# Patient Record
Sex: Male | Born: 1970 | Race: Black or African American | Hispanic: No | Marital: Married | Smoking: Never smoker
Health system: Southern US, Community
[De-identification: ages and names within clinical notes are randomized; demographics above are authoritative.]

## PROBLEM LIST (undated history)

## (undated) DIAGNOSIS — E782 Mixed hyperlipidemia: Secondary | ICD-10-CM

## (undated) DIAGNOSIS — E559 Vitamin D deficiency, unspecified: Secondary | ICD-10-CM

## (undated) DIAGNOSIS — J302 Other seasonal allergic rhinitis: Secondary | ICD-10-CM

## (undated) DIAGNOSIS — Z973 Presence of spectacles and contact lenses: Secondary | ICD-10-CM

## (undated) DIAGNOSIS — J45909 Unspecified asthma, uncomplicated: Secondary | ICD-10-CM

## (undated) DIAGNOSIS — K219 Gastro-esophageal reflux disease without esophagitis: Secondary | ICD-10-CM

## (undated) DIAGNOSIS — E118 Type 2 diabetes mellitus with unspecified complications: Secondary | ICD-10-CM

## (undated) DIAGNOSIS — G43909 Migraine, unspecified, not intractable, without status migrainosus: Secondary | ICD-10-CM

## (undated) HISTORY — DX: Migraine, unspecified, not intractable, without status migrainosus: G43.909

## (undated) HISTORY — DX: Vitamin D deficiency, unspecified: E55.9

## (undated) HISTORY — DX: Type 2 diabetes mellitus with unspecified complications: E11.8

## (undated) HISTORY — DX: Presence of spectacles and contact lenses: Z97.3

## (undated) HISTORY — PX: COLONOSCOPY: SHX174

## (undated) HISTORY — DX: Gastro-esophageal reflux disease without esophagitis: K21.9

## (undated) HISTORY — DX: Other seasonal allergic rhinitis: J30.2

## (undated) HISTORY — DX: Unspecified asthma, uncomplicated: J45.909

## (undated) HISTORY — DX: Mixed hyperlipidemia: E78.2

---

## 2001-09-06 HISTORY — PX: INGUINAL HERNIA REPAIR: SUR1180

## 2009-09-06 HISTORY — PX: MEDIAL COLLATERAL LIGAMENT REPAIR, KNEE: SHX2019

## 2010-07-03 ENCOUNTER — Emergency Department (HOSPITAL_BASED_OUTPATIENT_CLINIC_OR_DEPARTMENT_OTHER): Admission: EM | Admit: 2010-07-03 | Discharge: 2010-07-03 | Payer: Self-pay | Admitting: Emergency Medicine

## 2010-07-03 ENCOUNTER — Ambulatory Visit: Payer: Self-pay | Admitting: Diagnostic Radiology

## 2010-11-18 LAB — URINALYSIS, ROUTINE W REFLEX MICROSCOPIC
Ketones, ur: NEGATIVE mg/dL
Nitrite: NEGATIVE
Protein, ur: NEGATIVE mg/dL
pH: 5.5 (ref 5.0–8.0)

## 2010-11-18 LAB — DIFFERENTIAL
Eosinophils Absolute: 0.4 10*3/uL (ref 0.0–0.7)
Lymphs Abs: 1.5 10*3/uL (ref 0.7–4.0)
Neutro Abs: 2.4 10*3/uL (ref 1.7–7.7)
Neutrophils Relative %: 52 % (ref 43–77)

## 2010-11-18 LAB — COMPREHENSIVE METABOLIC PANEL
ALT: 24 U/L (ref 0–53)
BUN: 12 mg/dL (ref 6–23)
CO2: 31 mEq/L (ref 19–32)
Calcium: 9.7 mg/dL (ref 8.4–10.5)
Creatinine, Ser: 1 mg/dL (ref 0.4–1.5)
GFR calc non Af Amer: >60 mL/min (ref >60)
Glucose, Bld: 88 mg/dL (ref 70–99)
Sodium: 145 mEq/L (ref 135–145)

## 2010-11-18 LAB — CBC
HCT: 44.6 % (ref 39.0–52.0)
Hemoglobin: 14.7 g/dL (ref 13.0–17.0)
MCH: 27.6 pg (ref 26.0–34.0)
MCHC: 33 g/dL (ref 30.0–36.0)
MCV: 83.7 fL (ref 78.0–100.0)

## 2010-11-18 LAB — LIPASE, BLOOD: Lipase: 83 U/L (ref 23–300)

## 2011-02-09 ENCOUNTER — Ambulatory Visit (HOSPITAL_COMMUNITY)
Admission: RE | Admit: 2011-02-09 | Discharge: 2011-02-09 | Disposition: A | Discharge status: Home / Self Care | Payer: Managed Care, Other (non HMO) | Source: Ambulatory Visit | Attending: Orthopedic Surgery | Admitting: Orthopedic Surgery

## 2011-02-09 ENCOUNTER — Encounter (HOSPITAL_COMMUNITY)
Admission: RE | Admit: 2011-02-09 | Discharge: 2011-02-09 | Disposition: A | Discharge status: Home / Self Care | Payer: Managed Care, Other (non HMO) | Source: Ambulatory Visit | Attending: Orthopedic Surgery | Admitting: Orthopedic Surgery

## 2011-02-09 ENCOUNTER — Other Ambulatory Visit (HOSPITAL_COMMUNITY): Payer: Self-pay | Admitting: Orthopedic Surgery

## 2011-02-09 DIAGNOSIS — M5412 Radiculopathy, cervical region: Secondary | ICD-10-CM

## 2011-02-09 DIAGNOSIS — Z01810 Encounter for preprocedural cardiovascular examination: Secondary | ICD-10-CM | POA: Insufficient documentation

## 2011-02-09 DIAGNOSIS — Z01818 Encounter for other preprocedural examination: Secondary | ICD-10-CM | POA: Insufficient documentation

## 2011-02-09 DIAGNOSIS — Z01812 Encounter for preprocedural laboratory examination: Secondary | ICD-10-CM | POA: Insufficient documentation

## 2011-02-09 LAB — URINALYSIS, ROUTINE W REFLEX MICROSCOPIC
Glucose, UA: NEGATIVE mg/dL
Hgb urine dipstick: NEGATIVE
Ketones, ur: NEGATIVE mg/dL
Protein, ur: NEGATIVE mg/dL

## 2011-02-09 LAB — DIFFERENTIAL
Eosinophils Absolute: 0.3 10*3/uL (ref 0.0–0.7)
Eosinophils Relative: 5 % (ref 0–5)
Lymphs Abs: 1.5 10*3/uL (ref 0.7–4.0)
Monocytes Absolute: 0.3 10*3/uL (ref 0.1–1.0)
Monocytes Relative: 6 % (ref 3–12)

## 2011-02-09 LAB — COMPREHENSIVE METABOLIC PANEL
AST: 19 U/L (ref 0–37)
Alkaline Phosphatase: 65 U/L (ref 39–117)
BUN: 9 mg/dL (ref 6–23)
CO2: 30 mEq/L (ref 19–32)
Chloride: 102 mEq/L (ref 96–112)
Creatinine, Ser: 0.97 mg/dL (ref 0.4–1.5)
GFR calc Af Amer: >60 mL/min (ref >60)
GFR calc non Af Amer: >60 mL/min (ref >60)
Total Bilirubin: 0.6 mg/dL (ref 0.3–1.2)

## 2011-02-09 LAB — CBC
MCH: 27.2 pg (ref 26.0–34.0)
MCV: 83 fL (ref 78.0–100.0)
Platelets: 206 10*3/uL (ref 150–400)
RDW: 14 % (ref 11.5–15.5)

## 2011-02-09 LAB — TYPE AND SCREEN

## 2011-02-11 ENCOUNTER — Ambulatory Visit (HOSPITAL_COMMUNITY): Payer: Managed Care, Other (non HMO)

## 2011-02-11 ENCOUNTER — Ambulatory Visit (HOSPITAL_COMMUNITY)
Admission: RE | Admit: 2011-02-11 | Discharge: 2011-02-12 | DRG: 473 | Disposition: A | Discharge status: Home / Self Care | Payer: Managed Care, Other (non HMO) | Source: Ambulatory Visit | Attending: Orthopedic Surgery | Admitting: Orthopedic Surgery

## 2011-02-11 DIAGNOSIS — Z01812 Encounter for preprocedural laboratory examination: Secondary | ICD-10-CM | POA: Insufficient documentation

## 2011-02-11 DIAGNOSIS — Z01818 Encounter for other preprocedural examination: Secondary | ICD-10-CM | POA: Insufficient documentation

## 2011-02-11 DIAGNOSIS — M4802 Spinal stenosis, cervical region: Secondary | ICD-10-CM | POA: Insufficient documentation

## 2011-02-11 DIAGNOSIS — M5030 Other cervical disc degeneration, unspecified cervical region: Secondary | ICD-10-CM | POA: Insufficient documentation

## 2011-02-11 DIAGNOSIS — Z01810 Encounter for preprocedural cardiovascular examination: Secondary | ICD-10-CM | POA: Insufficient documentation

## 2011-02-16 NOTE — Op Note (Signed)
Mark Arroyo, Mark NO.:  [id number]  MEDICAL RECORD NO.:  [id number]  LOCATION:  3533                         FACILITY:  [facility]  PHYSICIAN:  Estill Bamberg, MD      DATE OF BIRTH:  11/03/1970  DATE OF PROCEDURE:  02/11/2011 DATE OF DISCHARGE:                              OPERATIVE REPORT   PREOPERATIVE DIAGNOSES: 1. Right-sided C7, C8 radiculopathy. 2. C6-7, C7-T1 degenerative disk disease.  POSTOPERATIVE DIAGNOSES: 1. Right-sided C7, C8 radiculopathy. 2. C6-7, C7-T1 degenerative disk disease.  PROCEDURE: 1. Anterior cervical decompression and fusion C6-7, C7-T1, placement     of structural allograft x2. 2. Use of local autograft. 3. Placement of anterior instrumentation C6-C1. 4. Intraoperative use of fluoroscopy.  SURGEON:  Estill Bamberg, MD.  ASSISTANT:  None.  ANESTHESIA:  General endotracheal anesthesia.  COMPLICATIONS:  None.  DISPOSITION:  Stable.  ESTIMATED BLOOD LOSS:  Minimal.  INDICATIONS FOR PROCEDURE:  Briefly, Mark Arroyo is a very pleasant 40- year-old male, who presented to my office with severe debilitating pain in his right arm.  He did go forward extensive nonoperative measures including epidural injections and the patient did go onto have pain.  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  I did feel that these findings did correlate to the patient's right arm pain.  We therefore did have a discussion regarding going forward with a C6-7 and C7-T1 ACDF to help alleviate the patient's pain in his right arm.  The patient did fully understand the risks and limitations of the procedure as outlined in my preoperative note.  OPERATIVE DETAILS:  On February 11, 2011, the patient was brought to surgery and general endotracheal anesthesia was administered.  The patient was placed supine on a hospital bed and the neck was placed in a gentle degree of extension.  The  shoulders taped into the inferior aspect of the bed.  Ancef was given and SCDs were placed.  Time-out procedure was performed.  I did bring in a lateral intraoperative fluoroscopic view to help optimize the location of the incision.  The neck was then prepped and draped in the usual sterile fashion.  I then made a transverse incision from approximately the midline to approximately the medial border of the sternocleidomastoid muscle.  The plane between the sternocleidomastoid muscle laterally and the strap muscles medially was identified and explored in the anterior cervical spine was readily identified.  I did place a clamp over the C6-7 interspace and a lateral fluoroscopic view did confirm this to be the appropriate level.  I then subperiosteally exposed the vertebral bodies of C6, C7, and T1 out to the uncovertebral joints bilaterally.  A self-retaining shadow line retractor was placed and I first turned my attention towards the C7-T1 interspace.  A preliminary diskectomy was performed.  I then placed Caspar pins into the C7-T1 vertebral bodies and gentle distraction was applied.  Of note, there were osteophytes noted anteriorly, which was removed with a rongeur and placed on the back table for later use.  I then went forward with a thorough diskectomy from front to back and out to  the uncovertebral joints bilaterally.  The posterior longitudinal ligament was readily identified and was entered using a nerve hook followed by #1 Kerrison.  I was able to fully adequate decompress the right neural foramen, the area of the neural foraminal stenosis.  At the termination of the decompression, I was easily able to pass a nerve hook out the neural foramen.  I then gently prepare the endplates using curettes and a 3-mm high-speed bur.  I did feel that a 7-mm intervertebral graft would be the appropriate fit.  I then hydrated a 7- mm allograft from the [employer].   The allograft was shaped into position and autograft obtained from the decortication aspect of the procedure was placed above and below the graft to help optimize the fusion success.  The allograft was tamped into position in the usual fashion under distraction.  The lower Caspar pin was removed and the bone wax was placed.  I then turned my attention towards the patient's C6-7 interspace.  I again perform an anterior cervical decompression in the manner described previously.  At the termination of the decompression, I was again easily able to pass the nerve hook out of the neural foramen on the right side.  I felt at this level that an 8-mm allograft would be the appropriate fit.  After preparing the endplates, a 7.9-mm allograft was tamped into position and I did again place autograft above and below the allograft to help optimize the fusion success.  I was very happy with the final press fit of each intervertebral graft.  I then removed the remaining two Caspar pins and bone wax was applied.  I then chose an appropriately sized Synthes Vector plate and this was bent into the appropriate amount of lordosis.  I placed the plate over the anterior cervical spine and 16-mm self-drilling and self-tapping screws were placed into the vertebral bodies of C6, C7, and T1.  I was very happy with the purchase of the screws.  Of note, AP and lateral fluoroscopic views were used to help optimize the trajectory of the screws.  I then copiously irrigated the wound.  The platysma and subcutaneous layer was closed using 2-0 Vicryl and the skin was closed using 4-0 Monocryl.  Sterile dressing was applied as was a hard cervical collar.  The patient was then transferred to recovery in stable condition.  Of note, the instrument count was correct at the termination of the procedure.     Estill Bamberg, MD     MD/MEDQ  D:  02/11/2011  T:  02/12/2011  Job:  [id number]  Electronically Signed by Estill Bamberg  on 02/16/2011 04:35:06 PM

## 2011-03-20 NOTE — Discharge Summary (Signed)
  NAMEHO, PARISI NO.:  [id number]  MEDICAL RECORD NO.:  [id number]  LOCATION:  3533                         FACILITY:  [facility]  PHYSICIAN:  Estill Bamberg, MD      DATE OF BIRTH:  10-22-1970  DATE OF ADMISSION:  02/11/2011 DATE OF DISCHARGE:  02/12/2011                              DISCHARGE SUMMARY   ADMISSION DIAGNOSIS:  Right-sided C7-C8 radiculopathy.  DISCHARGE DIAGNOSIS:  Right-sided C7-C8 radiculopathy.  PROCEDURE:  C6-C7, C7-T1 anterior cervical decompression and fusion performed on February 11, 2011.  ADMISSION HISTORY:  Briefly, Mark Arroyo is a pleasant 40 year old male who presented to my office with severe debilitating pain in his right arm.  The patient failed conservative care and was therefore admitted on the date above for the procedure noted above.  HOSPITAL COURSE:  On February 11, 2011, the patient was admitted and he underwent the procedure noted above.  The patient tolerated theprocedure well and was transferred to recovery in stable condition.  The patient noted immediate resolution of his right arm pain immediately following his procedure.  Upon my evaluation, the patient on the morning of postop day #1, the patient was noted to be neurovascularly intact and the patient did report complete resolution of his right arm pain.  The patient was uneventfully discharged on the morning of postoperative day #1.  DISCHARGE INSTRUCTIONS:  The patient will be maintained in his Aspen cervical collar for a total of 4 weeks.  He will keep the wound dry for a total of 5 days, at which point he can shower.  He will avoid lifting over 10 pounds.  He will take Percocet for pain and Valium for spasms. I will see the patient back in my office in approximately 2 weeks after his procedure.     Estill Bamberg, MD     MD/MEDQ  D:  03/15/2011  T:  03/15/2011  Job:  [id number]  Electronically Signed by Estill Bamberg  on 03/20/2011 04:15:40 PM

## 2011-07-23 NOTE — H&P (Signed)
PREOPERATIVE H&P  Chief Complaint: Right arm pain  HPI: Mark Arroyo is a 40 y.o. male who presents for right arm pain   History   Social History  . Marital Status: Single    Spouse Name: N/A    Number of Children: N/A  . Years of Education: N/A   Social History Main Topics  . Smoking status: Not on file  . Smokeless tobacco: Not on file  . Alcohol Use: Not on file  . Drug Use: Not on file  . Sexually Active: Not on file   Other Topics Concern  . Not on file   Social History Narrative  . No narrative on file   No family history on file. Allergies not on file Prior to Admission medications   Not on File     All other systems have been reviewed and were otherwise negative with the exception of those mentioned in the HPI and as above.  Physical Exam: There were no vitals filed for this visit.  General: Alert, no acute distress Cardiovascular: No pedal edema Respiratory: No cyanosis, no use of accessory musculature GI: No organomegaly, abdomen is soft and non-tender Skin: No lesions in the area of chief complaint Neurologic: Sensation intact distally Psychiatric: Patient is competent for consent with normal mood and affect Lymphatic: No axillary or cervical lymphadenopathy  MUSCULOSKELETAL: + spurlings sign on right  Assessment/Plan: Right cervical radiculopathy - plan for ACDF  Eleisha Branscomb LEONARD 07/23/2011 1:20 PM

## 2011-09-07 HISTORY — PX: VASECTOMY: SHX75

## 2011-09-07 HISTORY — PX: ANTERIOR CERVICAL DECOMP/DISCECTOMY FUSION: SHX1161

## 2013-08-06 HISTORY — PX: SHOULDER SURGERY: SHX246

## 2013-09-20 ENCOUNTER — Ambulatory Visit: Payer: Managed Care, Other (non HMO) | Attending: Orthopedic Surgery

## 2013-09-20 ENCOUNTER — Ambulatory Visit: Payer: Managed Care, Other (non HMO)

## 2013-09-20 DIAGNOSIS — M25619 Stiffness of unspecified shoulder, not elsewhere classified: Secondary | ICD-10-CM | POA: Insufficient documentation

## 2013-09-20 DIAGNOSIS — M25519 Pain in unspecified shoulder: Secondary | ICD-10-CM | POA: Insufficient documentation

## 2013-09-20 DIAGNOSIS — M6281 Muscle weakness (generalized): Secondary | ICD-10-CM | POA: Insufficient documentation

## 2013-09-20 DIAGNOSIS — IMO0001 Reserved for inherently not codable concepts without codable children: Secondary | ICD-10-CM | POA: Insufficient documentation

## 2013-09-25 ENCOUNTER — Ambulatory Visit: Payer: Managed Care, Other (non HMO)

## 2013-09-27 ENCOUNTER — Ambulatory Visit: Payer: Managed Care, Other (non HMO)

## 2013-10-02 ENCOUNTER — Ambulatory Visit: Payer: Managed Care, Other (non HMO)

## 2013-10-04 ENCOUNTER — Ambulatory Visit: Payer: Managed Care, Other (non HMO)

## 2013-10-09 ENCOUNTER — Ambulatory Visit: Payer: Managed Care, Other (non HMO) | Attending: Orthopedic Surgery | Admitting: Rehabilitation

## 2013-10-09 DIAGNOSIS — M25619 Stiffness of unspecified shoulder, not elsewhere classified: Secondary | ICD-10-CM | POA: Insufficient documentation

## 2013-10-09 DIAGNOSIS — M6281 Muscle weakness (generalized): Secondary | ICD-10-CM | POA: Insufficient documentation

## 2013-10-09 DIAGNOSIS — M25519 Pain in unspecified shoulder: Secondary | ICD-10-CM | POA: Insufficient documentation

## 2013-10-09 DIAGNOSIS — IMO0001 Reserved for inherently not codable concepts without codable children: Secondary | ICD-10-CM | POA: Insufficient documentation

## 2013-10-11 ENCOUNTER — Ambulatory Visit: Payer: Managed Care, Other (non HMO) | Admitting: Rehabilitation

## 2013-10-16 ENCOUNTER — Ambulatory Visit: Payer: Managed Care, Other (non HMO) | Admitting: Rehabilitation

## 2013-10-18 ENCOUNTER — Ambulatory Visit: Payer: Managed Care, Other (non HMO) | Admitting: Rehabilitation

## 2013-10-22 ENCOUNTER — Ambulatory Visit: Payer: Managed Care, Other (non HMO)

## 2013-10-25 ENCOUNTER — Ambulatory Visit: Payer: Managed Care, Other (non HMO)

## 2013-10-29 ENCOUNTER — Encounter: Payer: Managed Care, Other (non HMO) | Admitting: Rehabilitation

## 2013-10-31 ENCOUNTER — Encounter: Payer: Managed Care, Other (non HMO) | Admitting: Rehabilitation

## 2013-10-31 ENCOUNTER — Ambulatory Visit: Payer: Managed Care, Other (non HMO) | Admitting: Rehabilitation

## 2013-11-06 ENCOUNTER — Encounter: Payer: Managed Care, Other (non HMO) | Admitting: Rehabilitation

## 2013-11-07 ENCOUNTER — Ambulatory Visit: Payer: Managed Care, Other (non HMO) | Attending: Orthopedic Surgery

## 2013-11-07 DIAGNOSIS — IMO0001 Reserved for inherently not codable concepts without codable children: Secondary | ICD-10-CM | POA: Insufficient documentation

## 2013-11-07 DIAGNOSIS — M6281 Muscle weakness (generalized): Secondary | ICD-10-CM | POA: Insufficient documentation

## 2013-11-07 DIAGNOSIS — M25619 Stiffness of unspecified shoulder, not elsewhere classified: Secondary | ICD-10-CM | POA: Insufficient documentation

## 2013-11-07 DIAGNOSIS — M25519 Pain in unspecified shoulder: Secondary | ICD-10-CM | POA: Insufficient documentation

## 2013-11-12 ENCOUNTER — Ambulatory Visit: Payer: Managed Care, Other (non HMO) | Admitting: Rehabilitation

## 2013-11-15 ENCOUNTER — Ambulatory Visit: Payer: Managed Care, Other (non HMO)

## 2013-11-21 ENCOUNTER — Ambulatory Visit: Payer: Managed Care, Other (non HMO) | Admitting: Rehabilitation

## 2013-11-26 ENCOUNTER — Ambulatory Visit: Payer: Managed Care, Other (non HMO) | Admitting: Rehabilitation

## 2013-11-27 ENCOUNTER — Ambulatory Visit: Payer: Managed Care, Other (non HMO)

## 2013-12-04 ENCOUNTER — Ambulatory Visit: Payer: Managed Care, Other (non HMO) | Admitting: Rehabilitation

## 2013-12-06 ENCOUNTER — Ambulatory Visit: Payer: Managed Care, Other (non HMO) | Attending: Orthopedic Surgery | Admitting: Rehabilitation

## 2013-12-06 DIAGNOSIS — M6281 Muscle weakness (generalized): Secondary | ICD-10-CM | POA: Insufficient documentation

## 2013-12-06 DIAGNOSIS — IMO0001 Reserved for inherently not codable concepts without codable children: Secondary | ICD-10-CM | POA: Insufficient documentation

## 2013-12-06 DIAGNOSIS — M25519 Pain in unspecified shoulder: Secondary | ICD-10-CM | POA: Insufficient documentation

## 2013-12-06 DIAGNOSIS — M25619 Stiffness of unspecified shoulder, not elsewhere classified: Secondary | ICD-10-CM | POA: Insufficient documentation

## 2013-12-10 ENCOUNTER — Ambulatory Visit: Payer: Managed Care, Other (non HMO) | Admitting: Rehabilitation

## 2013-12-12 ENCOUNTER — Ambulatory Visit: Payer: Managed Care, Other (non HMO) | Admitting: Rehabilitation

## 2013-12-17 ENCOUNTER — Ambulatory Visit: Payer: Managed Care, Other (non HMO) | Admitting: Rehabilitation

## 2013-12-19 ENCOUNTER — Ambulatory Visit: Payer: Managed Care, Other (non HMO) | Admitting: Rehabilitation

## 2014-07-02 ENCOUNTER — Ambulatory Visit (INDEPENDENT_AMBULATORY_CARE_PROVIDER_SITE_OTHER): Payer: Managed Care, Other (non HMO) | Admitting: Medical

## 2014-07-02 ENCOUNTER — Encounter: Payer: Self-pay | Admitting: Medical

## 2014-07-02 VITALS — BP 100/70 | HR 64 | Temp 98.0°F | Resp 16 | Ht 71.0 in | Wt 223.0 lb

## 2014-07-02 DIAGNOSIS — J4521 Mild intermittent asthma with (acute) exacerbation: Secondary | ICD-10-CM

## 2014-07-02 DIAGNOSIS — J0110 Acute frontal sinusitis, unspecified: Secondary | ICD-10-CM

## 2014-07-02 MED ORDER — AMOXICILLIN-POT CLAVULANATE 875-125 MG PO TABS: 1.0000 | ORAL_TABLET | Freq: Two times a day (BID) | ORAL | Status: DC

## 2014-07-02 MED ORDER — METHYLPREDNISOLONE ACETATE PF 80 MG/ML IJ SUSP
60.0000 mg | Freq: Once | INTRAMUSCULAR | Status: AC
  Administered 2014-07-02: 60 mg via INTRAMUSCULAR

## 2014-07-02 NOTE — Addendum Note (Signed)
Addended by: Janeice RobinsonSCALES, Loren Vicens L on: 07/02/2014 02:59 PM   Modules accepted: Orders

## 2014-07-02 NOTE — Progress Notes (Signed)
Subjective:  Mark Arroyo is a 43 y.o. male who presents for possible sinus infection. Here as a new patient today  Symptoms include week history of sinus pressure, ear pressure, yellow nasal drainage, chills, cough, headache, wheezing, as history of asthma, congestion.  Denies fever, nausea vomiting, diarrhea, no sore throat.  He complains of tender nodes. He notes a history of chronic sinus infections and asthma.  He is a nonsmoker. Using Mucinex and Allegra. He has 1 positive sick contact.  No other aggravating or relieving factors.  No other c/o.  ROS as in subjective   Objective: Filed Vitals:   07/02/14 1343  BP: 100/70  Pulse: 64  Temp: 98 F (36.7 C)  Resp: 16    General appearance: Alert, WD/WN, no distress                             Skin: warm, no rash                           Head: + tender maxillary and frontal sinus tenderness,                            Eyes: conjunctiva normal, corneas clear, PERRLA                            Ears: pearly TMs, external ear canals normal                          Nose: septum midline, turbinates swollen, with erythema and mucoid discharge             Mouth/throat: MMM, tongue normal, mild pharyngeal erythema                           Neck: supple, no adenopathy, no thyromegaly, nontender                          Heart: RRR, normal S1, S2, no murmurs                         Lungs: CTA bilaterally, no wheezes, rales, or rhonchi      Assessment and Plan:  Encounter Diagnoses  Name Primary?  . Acute frontal sinusitis, recurrence not specified Yes  . Asthma with acute exacerbation, mild intermittent     Prescription given for Augmentin, 60mg  DepoMedrol IM today.  Can use OTC Mucinex for congestion.  Tylenol or Ibuprofen OTC for fever and malaise.  Asthma hx/o is mild intermittent, discussed symptoms that would prompt recheck, more intense therapy.   Discussed symptomatic relief, nasal saline flush, and call or return if worse or not  improving in 2-3 days.  Will request prior records from [facility] [place]

## 2014-07-22 ENCOUNTER — Telehealth: Payer: Self-pay | Admitting: Medical

## 2014-07-22 NOTE — Telephone Encounter (Signed)
Records received from previous provider. Sending back for review. Please note what needs to be abstracted or scanned.

## 2014-09-17 ENCOUNTER — Encounter: Payer: Self-pay | Admitting: Medical

## 2015-01-15 ENCOUNTER — Ambulatory Visit (INDEPENDENT_AMBULATORY_CARE_PROVIDER_SITE_OTHER): Payer: Managed Care, Other (non HMO) | Admitting: Medical

## 2015-01-15 ENCOUNTER — Encounter: Payer: Self-pay | Admitting: Medical

## 2015-01-15 VITALS — BP 100/60 | HR 67 | Temp 98.0°F | Resp 16 | Ht 71.0 in | Wt 214.0 lb

## 2015-01-15 DIAGNOSIS — Z23 Encounter for immunization: Secondary | ICD-10-CM | POA: Diagnosis not present

## 2015-01-15 DIAGNOSIS — Z0000 Encounter for general adult medical examination without abnormal findings: Secondary | ICD-10-CM | POA: Diagnosis not present

## 2015-01-15 DIAGNOSIS — J4520 Mild intermittent asthma, uncomplicated: Secondary | ICD-10-CM | POA: Insufficient documentation

## 2015-01-15 DIAGNOSIS — Z125 Encounter for screening for malignant neoplasm of prostate: Secondary | ICD-10-CM | POA: Diagnosis not present

## 2015-01-15 LAB — CBC
HCT: 44.7 % (ref 39.0–52.0)
HEMOGLOBIN: 14.5 g/dL (ref 13.0–17.0)
MCH: 26.2 pg (ref 26.0–34.0)
MCHC: 32.4 g/dL (ref 30.0–36.0)
MCV: 80.8 fL (ref 78.0–100.0)
MPV: 11 fL (ref 8.6–12.4)
PLATELETS: 213 10*3/uL (ref 150–400)
RBC: 5.53 MIL/uL (ref 4.22–5.81)
RDW: 14.5 % (ref 11.5–15.5)
WBC: 4.6 10*3/uL (ref 4.0–10.5)

## 2015-01-15 LAB — COMPREHENSIVE METABOLIC PANEL
ALK PHOS: 56 U/L (ref 39–117)
ALT: 15 U/L (ref 0–53)
AST: 13 U/L (ref 0–37)
Albumin: 4.2 g/dL (ref 3.5–5.2)
BUN: 8 mg/dL (ref 6–23)
CO2: 29 mEq/L (ref 19–32)
Calcium: 9.4 mg/dL (ref 8.4–10.5)
Chloride: 102 mEq/L (ref 96–112)
Creat: 0.92 mg/dL (ref 0.50–1.35)
Glucose, Bld: 93 mg/dL (ref 70–99)
Potassium: 4 mEq/L (ref 3.5–5.3)
SODIUM: 138 meq/L (ref 135–145)
Total Bilirubin: 1 mg/dL (ref 0.2–1.2)
Total Protein: 7.3 g/dL (ref 6.0–8.3)

## 2015-01-15 LAB — POCT URINALYSIS DIPSTICK
BILIRUBIN UA: NEGATIVE
GLUCOSE UA: NEGATIVE
KETONES UA: NEGATIVE
Leukocytes, UA: NEGATIVE
NITRITE UA: NEGATIVE
PROTEIN UA: NEGATIVE
RBC UA: NEGATIVE
Spec Grav, UA: >=1.03
UROBILINOGEN UA: NEGATIVE
pH, UA: 6

## 2015-01-15 LAB — LIPID PANEL
Cholesterol: 198 mg/dL (ref 0–200)
HDL: 49 mg/dL (ref >=40)
LDL CALC: 130 mg/dL — AB (ref 0–99)
Total CHOL/HDL Ratio: 4 Ratio
Triglycerides: 97 mg/dL (ref <150)
VLDL: 19 mg/dL (ref 0–40)

## 2015-01-15 LAB — TSH: TSH: 1.804 u[IU]/mL (ref 0.350–4.500)

## 2015-01-15 MED ORDER — ALBUTEROL SULFATE 108 (90 BASE) MCG/ACT IN AEPB: 2.0000 | INHALATION_SPRAY | Freq: Four times a day (QID) | RESPIRATORY_TRACT | Status: DC | PRN

## 2015-01-15 NOTE — Progress Notes (Signed)
Subjective:   HPI  Mark Arroyo is a 44 y.o. male who presents for a complete physical.   Preventative care: Last ophthalmology visit: YES UNSURE OF THE NAME OF THE DOCTOR. LAST VISIT 04/2014 Last dental visit: YES UNSURE OF THE NAME SEEN 2 X A YEAR Last colonoscopy:N/A Last prostate exam: ? Last EKG:N/A Last labs:?  Prior vaccinations: TD or Tdap: WITHIN 10 YEARS  Influenza:DECLINED Pneumococcal:N/A Shingles/Zostavax:N/A  Concerns: none  Reviewed their medical, surgical, family, social, medication, and allergy history and updated chart as appropriate.  Past Medical History  Diagnosis Date  . Asthma   . Seasonal allergic rhinitis   . Wears glasses   . Migraines     usually stress related, no frequent headaches since 04/2014  . GERD (gastroesophageal reflux disease)     Past Surgical History  Procedure Laterality Date  . Anterior cervical decomp/discectomy fusion  2013    Dr. Yevette Edwardsumonski  . Medial collateral ligament repair, knee  2011    left  . Shoulder surgery  08/2013    left, labrum repair, RTC repair, spur removal; [facility]  . Inguinal hernia repair  2003    right  . Nasal sinus surgery      x2  . Colonoscopy  38    due to stomach issues, normal, [facility]    History   Social History  . Marital Status: Married    Spouse Name: N/A  . Number of Children: N/A  . Years of Education: N/A   Occupational History  . Not on file.   Social History Main Topics  . Smoking status: Never Smoker   . Smokeless tobacco: Not on file  . Alcohol Use: No  . Drug Use: No  . Sexual Activity: Not on file   Other Topics Concern  . 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  Christian.  Hobbies - likes basketball, video games    Family History  Problem Relation Age of Onset  . Diabetes Mother   . Heart disease Father [id number]    MI   . Kidney disease Sister   . Diabetes Brother   . Cancer Neg Hx      Current outpatient prescriptions:  .  Albuterol Sulfate (PROAIR HFA IN), Inhale into the lungs., Disp: , Rfl:  .  Albuterol Sulfate (PROAIR RESPICLICK) 108 (90 BASE) MCG/ACT AEPB, Inhale 2 puffs into the lungs every 6 (six) hours as needed., Disp: 1 each, Rfl: 0  No Known Allergies   Review of Systems Constitutional: -fever, -chills, -sweats, -unexpected weight change, -decreased appetite, -fatigue Allergy: -sneezing, -itching, +congestion Dermatology: -changing moles, --rash, -lumps ENT: -runny nose, -ear pain, -sore throat, -hoarseness, -sinus pain, -teeth pain, - ringing in ears, -hearing loss, -nosebleeds Cardiology: -chest pain, -palpitations, -swelling, -difficulty breathing when lying flat, -waking up short of breath Respiratory: -cough, -shortness of breath, -difficulty breathing with exercise or exertion, +wheezing, -coughing up blood Gastroenterology: -abdominal pain, -nausea, -vomiting, -diarrhea, -constipation, -blood in stool, -changes in bowel movement, -difficulty swallowing or eating Hematology: -bleeding, -bruising  Musculoskeletal: -joint aches, -muscle aches, -joint swelling, -back pain, -neck pain, -cramping, -changes in gait Ophthalmology: denies vision changes, eye redness, itching, discharge Urology: -burning with urination, -difficulty urinating, -blood in urine, -urinary frequency, -urgency, -incontinence Neurology: +headache, -weakness, -tingling, -numbness, -memory loss, -falls, -dizziness Psychology: -depressed mood, -agitation, -sleep problems  Objective:   Physical Exam  BP 100/60 mmHg  Pulse 67  Temp(Src) 98 F (36.7 C) (Oral)  Resp 16  Ht 5\' 11"  (1.803 m)  Wt 214 lb (97.07 kg)  BMI 29.86 kg/m2  General appearance: alert, no distress, WD/WN, AA male Skin: 2 small flat brown birth marks <4cm diameter in left and low back, few scattered macules, no worrisome lesions, right  upper arm lateraly with dagger tattoo HEENT: normocephalic, conjunctiva/corneas normal, sclerae anicteric, PERRLA, EOMi, nares patent, no discharge or erythema, pharynx normal Oral cavity: MMM, tongue normal, teeth normal Neck: faint anterior neck surgical scar, supple, no lymphadenopathy, no thyromegaly, no masses, normal ROM, no bruits Chest: non tender, normal shape and expansion Heart: RRR, normal S1, S2, no murmurs Lungs: CTA bilaterally, no wheezes, rhonchi, or rales Abdomen: +bs, soft, non tender, non distended, no masses, no hepatomegaly, no splenomegaly, no bruits Back: non tender, normal ROM, no scoliosis Musculoskeletal: port surgical scars of left shoulder and left knee, upper extremities non tender, no obvious deformity, normal ROM throughout, lower extremities non tender, no obvious deformity, normal ROM throughout Extremities: no edema, no cyanosis, no clubbing Pulses: 2+ symmetric, upper and lower extremities, normal cap refill Neurological: alert, oriented x 3, CN2-12 intact, strength normal upper extremities and lower extremities, sensation normal throughout, DTRs 2+ throughout, no cerebellar signs, gait normal Psychiatric: normal affect, behavior normal, pleasant  GU: normal male external genitalia, circumcised, nontender, left inguinal hernia surgical scar, no masses, no hernia, no lymphadenopathy Rectal: anus normal tone, prostate WNL   Assessment and Plan :    Encounter Diagnoses  Name Primary?  . Encounter for health maintenance examination in adult Yes  . Asthma, mild intermittent, uncomplicated   . Screening for prostate cancer   . Need for prophylactic vaccination against Streptococcus pneumoniae (pneumococcus)     Physical exam - discussed healthy lifestyle, diet, exercise, preventative care, vaccinations, and addressed their concerns.   Routine screening labs today See your eye doctor yearly for routine vision care. See your dentist yearly for routine dental  care including hygiene visits twice yearly.  Asthma - mild intermittent, avoid triggers, albuterol prn Discussed prostate and testicular cancer screening, risks/benefits  Vaccines: Counseled on the pneumococcal vaccine.  Vaccine information sheet given.  Pneumococcal vaccine, [id number] given after consent obtained. Advised yearly flu vaccine  Follow-up pending labs

## 2015-01-15 NOTE — Addendum Note (Signed)
Addended by: Leretha DykesSCALES, Chriss Redel L on: 01/15/2015 11:50 AM   Modules accepted: Orders

## 2015-01-16 LAB — HEMOGLOBIN A1C
Hgb A1c MFr Bld: 6.1 % — ABNORMAL HIGH (ref <5.7)
Mean Plasma Glucose: 128 mg/dL — ABNORMAL HIGH (ref <117)

## 2015-01-16 LAB — PSA: PSA: 0.69 ng/mL (ref <=4.00)

## 2015-05-12 ENCOUNTER — Emergency Department (HOSPITAL_COMMUNITY): Payer: Managed Care, Other (non HMO)

## 2015-05-12 ENCOUNTER — Emergency Department (HOSPITAL_COMMUNITY)
Admission: EM | Admit: 2015-05-12 | Discharge: 2015-05-12 | Disposition: A | Discharge status: Home / Self Care | Payer: Managed Care, Other (non HMO) | Attending: Emergency Medicine | Admitting: Emergency Medicine

## 2015-05-12 DIAGNOSIS — Z8679 Personal history of other diseases of the circulatory system: Secondary | ICD-10-CM | POA: Insufficient documentation

## 2015-05-12 DIAGNOSIS — G510 Bell's palsy: Secondary | ICD-10-CM | POA: Insufficient documentation

## 2015-05-12 DIAGNOSIS — Z8719 Personal history of other diseases of the digestive system: Secondary | ICD-10-CM | POA: Insufficient documentation

## 2015-05-12 DIAGNOSIS — R29810 Facial weakness: Secondary | ICD-10-CM

## 2015-05-12 DIAGNOSIS — Z79899 Other long term (current) drug therapy: Secondary | ICD-10-CM | POA: Insufficient documentation

## 2015-05-12 DIAGNOSIS — J45909 Unspecified asthma, uncomplicated: Secondary | ICD-10-CM | POA: Diagnosis not present

## 2015-05-12 DIAGNOSIS — R51 Headache: Secondary | ICD-10-CM | POA: Diagnosis present

## 2015-05-12 LAB — CBC
HCT: 44.3 % (ref 39.0–52.0)
Hemoglobin: 14.2 g/dL (ref 13.0–17.0)
MCH: 26.2 pg (ref 26.0–34.0)
MCHC: 32.1 g/dL (ref 30.0–36.0)
MCV: 81.9 fL (ref 78.0–100.0)
Platelets: 199 10*3/uL (ref 150–400)
RBC: 5.41 MIL/uL (ref 4.22–5.81)
RDW: 14 % (ref 11.5–15.5)
WBC: 5.8 10*3/uL (ref 4.0–10.5)

## 2015-05-12 LAB — I-STAT CHEM 8, ED
BUN: 10 mg/dL (ref 6–20)
CALCIUM ION: 1.2 mmol/L (ref 1.12–1.23)
Chloride: 102 mmol/L (ref 101–111)
Creatinine, Ser: 1.1 mg/dL (ref 0.61–1.24)
Glucose, Bld: 112 mg/dL — ABNORMAL HIGH (ref 65–99)
HEMATOCRIT: 47 % (ref 39.0–52.0)
Hemoglobin: 16 g/dL (ref 13.0–17.0)
Potassium: 3.9 mmol/L (ref 3.5–5.1)
SODIUM: 140 mmol/L (ref 135–145)
TCO2: 24 mmol/L (ref 0–100)

## 2015-05-12 LAB — COMPREHENSIVE METABOLIC PANEL
ALBUMIN: 3.8 g/dL (ref 3.5–5.0)
ALT: 15 U/L — ABNORMAL LOW (ref 17–63)
ANION GAP: 7 (ref 5–15)
AST: 26 U/L (ref 15–41)
Alkaline Phosphatase: 58 U/L (ref 38–126)
BUN: 8 mg/dL (ref 6–20)
CHLORIDE: 105 mmol/L (ref 101–111)
CO2: 28 mmol/L (ref 22–32)
Calcium: 9.4 mg/dL (ref 8.9–10.3)
Creatinine, Ser: 1.05 mg/dL (ref 0.61–1.24)
GFR calc Af Amer: >60 mL/min (ref >60)
GLUCOSE: 117 mg/dL — AB (ref 65–99)
POTASSIUM: 3.9 mmol/L (ref 3.5–5.1)
Sodium: 140 mmol/L (ref 135–145)
Total Bilirubin: 1.1 mg/dL (ref 0.3–1.2)
Total Protein: 7.1 g/dL (ref 6.5–8.1)

## 2015-05-12 LAB — DIFFERENTIAL
Basophils Absolute: 0 10*3/uL (ref 0.0–0.1)
Basophils Relative: 0 % (ref 0–1)
Eosinophils Absolute: 0.6 10*3/uL (ref 0.0–0.7)
Eosinophils Relative: 11 % — ABNORMAL HIGH (ref 0–5)
Lymphocytes Relative: 35 % (ref 12–46)
Lymphs Abs: 2 10*3/uL (ref 0.7–4.0)
Monocytes Absolute: 0.2 10*3/uL (ref 0.1–1.0)
Monocytes Relative: 4 % (ref 3–12)
Neutro Abs: 2.9 10*3/uL (ref 1.7–7.7)
Neutrophils Relative %: 50 % (ref 43–77)

## 2015-05-12 LAB — PROTIME-INR
INR: 1.02 (ref 0.00–1.49)
PROTHROMBIN TIME: 13.6 s (ref 11.6–15.2)

## 2015-05-12 LAB — CBG MONITORING, ED: Glucose-Capillary: 114 mg/dL — ABNORMAL HIGH (ref 65–99)

## 2015-05-12 LAB — I-STAT TROPONIN, ED: TROPONIN I, POC: 0 ng/mL (ref 0.00–0.08)

## 2015-05-12 LAB — APTT: aPTT: 31 seconds (ref 24–37)

## 2015-05-12 MED ORDER — VALACYCLOVIR HCL 500 MG PO TABS
500.0000 mg | ORAL_TABLET | Freq: Once | ORAL | Status: AC
  Administered 2015-05-12: 500 mg via ORAL
  Filled 2015-05-12: qty 1

## 2015-05-12 MED ORDER — PREDNISONE 10 MG PO TABS: ORAL_TABLET | ORAL | Status: DC

## 2015-05-12 MED ORDER — ARTIFICIAL TEARS OP OINT: TOPICAL_OINTMENT | OPHTHALMIC | Status: DC | PRN

## 2015-05-12 MED ORDER — VALACYCLOVIR HCL 1 G PO TABS: 1000.0000 mg | ORAL_TABLET | Freq: Three times a day (TID) | ORAL | Status: AC

## 2015-05-12 MED ORDER — PREDNISONE 20 MG PO TABS
60.0000 mg | ORAL_TABLET | Freq: Once | ORAL | Status: AC
  Administered 2015-05-12: 60 mg via ORAL
  Filled 2015-05-12: qty 3

## 2015-05-12 MED ORDER — SODIUM CHLORIDE 0.9 % IV BOLUS (SEPSIS)
500.0000 mL | Freq: Once | INTRAVENOUS | Status: AC
  Administered 2015-05-12: 500 mL via INTRAVENOUS

## 2015-05-12 NOTE — ED Notes (Signed)
Pt transported to MRI by this RN  ?

## 2015-05-12 NOTE — Consult Note (Signed)
Stroke Consult    Chief Complaint: right facial numbness and weakness HPI: Mark Arroyo is an 44 y.o. male with no noted stroke risk factors presenting with acute onset of right facial numbness and weakness. LSW 1400 at which time he noted a strange sensation in his right ear. Shortly after he noted the right side of his face felt strange. A neighbor noted that the right side of his face looked different. Denies any extremity weakness, no visual or speech deficits. No prior CVA or TIA history. Has history of migraines. Currently denies a migraine.   CT head imaging reviewed and overall unremarkable. Initial NIHSS of 2.   Date last known well: 05/12/2015 Time last known well: 1400 tPA Given:no, too mild to treat Modified Rankin: Rankin Score=0  Past Medical History  Diagnosis Date  . Asthma   . Seasonal allergic rhinitis   . Wears glasses   . Migraines     usually stress related, no frequent headaches since 04/2014  . GERD (gastroesophageal reflux disease)     Past Surgical History  Procedure Laterality Date  . Anterior cervical decomp/discectomy fusion  2013    Dr. Yevette Edwards  . Medial collateral ligament repair, knee  2011    left  . Shoulder surgery  08/2013    left, labrum repair, RTC repair, spur removal; [place]  . Inguinal hernia repair  2003    right  . Nasal sinus surgery      x2  . Colonoscopy  38    due to stomach issues, normal, [place]    Family History  Problem Relation Age of Onset  . Diabetes Mother   . Heart disease Father 60    MI  . Kidney disease Sister   . Diabetes Brother   . Cancer Neg Hx    Social History:  reports that he has never smoked. He does not have any smokeless tobacco history on file. He reports that he does not drink alcohol or use illicit drugs.  Allergies: No Known Allergies   (Not in a hospital admission)  ROS: Out of a complete 14 system review, the patient complains of only the following symptoms, and all other  reviewed systems are negative. +facial weakness  Physical Examination: Filed Vitals:   05/12/15 1639  BP: 125/77  Pulse: 70  Temp:   Resp: 16   Physical Exam  Constitutional: He appears well-developed and well-nourished.  Psych: Affect appropriate to situation Eyes: No scleral injection HENT: No OP obstrucion Head: Normocephalic.  Cardiovascular: Normal rate and regular rhythm.  Respiratory: Effort normal and breath sounds normal.  GI: Soft. Bowel sounds are normal. No distension. There is no tenderness.  Skin: WDI   Neurologic Examination: Mental Status: Alert, oriented, thought content appropriate.  Speech fluent without evidence of aphasia.  Able to follow 3 step commands without difficulty. Cranial Nerves: II: funduscopic exam wnl bilaterally, visual fields grossly normal, pupils equal, round, reactive to light and accommodation III,IV, VI: ptosis not present, extra-ocular motions intact bilaterally V,VII: minimal right facial droop, at rest weakness of right eyelid, with activation question of some right facial weakness, decreased LT and PP on right side VIII: hearing normal bilaterally IX,X: gag reflex present XI: trapezius strength/neck flexion strength normal bilaterally XII: tongue strength normal  Motor: Right : Upper extremity    Left:     Upper extremity 5/5 deltoid       5/5 deltoid 5/5 biceps      5/5 biceps  5/5 triceps  5/5 triceps 5/5 hand grip      5/5 hand grip  Lower extremity     Lower extremity 5/5 hip flexor      5/5 hip flexor 5/5 quadricep      5/5 quadriceps  5/5 hamstrings     5/5 hamstrings 5/5 plantar flexion       5/5 plantar flexion 5/5 plantar extension     5/5 plantar extension Tone and bulk:normal tone throughout; no atrophy noted Sensory: Pinprick and light touch intact throughout, bilaterally Deep Tendon Reflexes: 2+ and symmetric throughout Plantars: Right: downgoing   Left: downgoing Cerebellar: normal finger-to-nose,  normal rapid alternating movements and normal heel-to-shin test Gait: normal gait and station  Laboratory Studies:   Basic Metabolic Panel:  Recent Labs Lab 05/12/15 1635  NA 140  K 3.9  CL 102  GLUCOSE 112*  BUN 10  CREATININE 1.10    Liver Function Tests: No results for input(s): AST, ALT, ALKPHOS, BILITOT, PROT, ALBUMIN in the last 168 hours. No results for input(s): LIPASE, AMYLASE in the last 168 hours. No results for input(s): AMMONIA in the last 168 hours.  CBC:  Recent Labs Lab 05/12/15 1626 05/12/15 1635  WBC 5.8  --   NEUTROABS 2.9  --   HGB 14.2 16.0  HCT 44.3 47.0  MCV 81.9  --   PLT 199  --     Cardiac Enzymes: No results for input(s): CKTOTAL, CKMB, CKMBINDEX, TROPONINI in the last 168 hours.  BNP: Invalid input(s): POCBNP  CBG:  Recent Labs Lab 05/12/15 1637  GLUCAP 114*    Microbiology: No results found for this or any previous visit.  Coagulation Studies:  Recent Labs  05/12/15 1626  LABPROT 13.6  INR 1.02    Urinalysis: No results for input(s): COLORURINE, LABSPEC, PHURINE, GLUCOSEU, HGBUR, BILIRUBINUR, KETONESUR, PROTEINUR, UROBILINOGEN, NITRITE, LEUKOCYTESUR in the last 168 hours.  Invalid input(s): APPERANCEUR  Lipid Panel:     Component Value Date/Time   CHOL 198 01/15/2015 1051   TRIG 97 01/15/2015 1051   HDL 49 01/15/2015 1051   CHOLHDL 4.0 01/15/2015 1051   VLDL 19 01/15/2015 1051   LDLCALC 130* 01/15/2015 1051    HgbA1C:  Lab Results  Component Value Date   HGBA1C 6.1* 01/15/2015    Urine Drug Screen:  No results found for: LABOPIA, COCAINSCRNUR, LABBENZ, AMPHETMU, THCU, LABBARB  Alcohol Level: No results for input(s): ETH in the last 168 hours.  Other results:  Imaging: Ct Head Wo Contrast  05/12/2015   CLINICAL DATA:  Code stroke. Right-sided facial droop since 2 o'clock p.m.  EXAM: CT HEAD WITHOUT CONTRAST  TECHNIQUE: Contiguous axial images were obtained from the base of the skull through the vertex  without intravenous contrast.  COMPARISON:  None.  FINDINGS: There is no intra or extra-axial fluid collection or mass lesion. The basilar cisterns and ventricles have a normal appearance. There is no CT evidence for acute infarction or hemorrhage. There is significant sinus disease involving the ethmoid visualize maxillary, ethmoid, and frontal air cells. There is irregularity visualized portion of the nasal bones and some of the ethmoid sinus swells medial wall of the left orbit, consistent with trauma and possibly representing old fractures. The mastoid air cells are normally aerated. No acute fractures are identified.  IMPRESSION: 1.  No evidence for acute intracranial abnormality. 2. Sinus disease. Probable chronic fractures of the left orbit and sinuses. Critical Value/emergent results were called by telephone at the time of interpretation on 05/12/2015 at 4:43  pm to Dr. Hosie Poisson, who verbally acknowledged these results.   Electronically Signed   By: Norva Pavlov M.D.   On: 05/12/2015 16:44    Assessment: 44 y.o. male hx of migraines presenting with acute onset of right facial numbness and sensory deficits. Code stroke activated. NIHSS of 2, tPA discussed with patient and spouse, discussed risk and benefits. Decision made to hold tPA due to mildness of symptoms. Differential is small infarct vs Bells palsy   -MRI brain. If positive complete stroke workup -If MRI negative would treat for likely Bells palsy  Elspeth Cho, DO [facility] [phone]  If 7pm- 7am, please page neurology on call as listed in [facility]. 05/12/2015, 4:47 PM

## 2015-05-12 NOTE — ED Notes (Signed)
Pt given eye patch to wear at night while sleeping

## 2015-05-12 NOTE — Discharge Instructions (Signed)
Bell's Palsy °Bell's palsy is a condition in which the muscles on one side of the face cannot move (paralysis). This is because the nerves in the face are paralyzed. It is most often thought to be caused by a virus. The virus causes swelling of the nerve that controls movement on one side of the face. The nerve travels through a tight space surrounded by bone. When the nerve swells, it can be compressed by the bone. This results in damage to the protective covering around the nerve. This damage interferes with how the nerve communicates with the muscles of the face. As a result, it can cause weakness or paralysis of the facial muscles.  °Injury (trauma), tumor, and surgery may cause Bell's palsy, but most of the time the cause is unknown. It is a relatively common condition. It starts suddenly (abrupt onset) with the paralysis usually ending within 2 days. Bell's palsy is not dangerous. But because the eye does not close properly, you may need care to keep the eye from getting dry. This can include splinting (to keep the eye shut) or moistening with artificial tears. Bell's palsy very seldom occurs on both sides of the face at the same time. °SYMPTOMS  °· Eyebrow sagging. °· Drooping of the eyelid and corner of the mouth. °· Inability to close one eye. °· Loss of taste on the front of the tongue. °· Sensitivity to loud noises. °TREATMENT  °The treatment is usually non-surgical. If the patient is seen within the first 24 to 48 hours, a short course of steroids may be prescribed, in an attempt to shorten the length of the condition. Antiviral medicines may also be used with the steroids, but it is unclear if they are helpful.  °You will need to protect your eye, if you cannot close it. The cornea (clear covering over your eye) will become dry and can be damaged. Artificial tears can be used to keep your eye moist. Glasses or an eye patch should be worn to protect your eye. °PROGNOSIS  °Recovery is variable, ranging  from days to months. Although the problem usually goes away completely (about 80% of cases resolve), predicting the outcome is impossible. Most people improve within 3 weeks of when the symptoms began. Improvement may continue for 3 to 6 months. A small number of people have moderate to severe weakness that is permanent.  °HOME CARE INSTRUCTIONS  °· If your caregiver prescribed medication to reduce swelling in the nerve, use as directed. Do not stop taking the medication unless directed by your caregiver. °· Use moisturizing eye drops as needed to prevent drying of your eye, as directed by your caregiver. °· Protect your eye, as directed by your caregiver. °· Use facial massage and exercises, as directed by your caregiver. °· Perform your normal activities, and get your normal rest. °SEEK IMMEDIATE MEDICAL CARE IF:  °· There is pain, redness or irritation in the eye. °· You or your child has an oral temperature above 102° F (38.9° C), not controlled by medicine. °MAKE SURE YOU:  °· Understand these instructions. °· Will watch your condition. °· Will get help right away if you are not doing well or get worse. °Document Released: 08/23/2005 Document Revised: 11/15/2011 Document Reviewed: 11/30/2013 °ExitCare® Patient Information ©2015 [employer]. This information is not intended to replace advice given to you by your health care provider. Make sure you discuss any questions you have with your health care provider. ° °

## 2015-05-12 NOTE — ED Notes (Signed)
Code stroke activated @ 1623

## 2015-05-12 NOTE — ED Notes (Signed)
Pt from home for eval of of right sided facial droop and right sided facial numbness at 1400 today, no weakness noted,decreased sensation to right face. Code stroke activated,

## 2015-05-12 NOTE — ED Notes (Signed)
Dr. Hosie Poisson at bedside. Decision to hold TPA due to relatively mild symptoms. Explained to patient and patient states he understands. Pt to continue in window until 1830.

## 2015-05-12 NOTE — ED Provider Notes (Signed)
CSN: [id number]     Arrival date & time 05/12/15  1617 History   First MD Initiated Contact with Patient 05/12/15 1626     Chief Complaint  Patient presents with  . Code Stroke      HPI  Patient presents for evaluation of right-sided numbness and weakness starting at 2:00 this afternoon. Last time known normal was 1400.  That he just noticed that the right side of his face felt strange. Short time later he went outside and a neighbor told him that he take out it looked strange. He was trying to drink something and felt like it was strange in his mouth that he couldn't feel it or moving around. No frank dysphagia. No headache. No upper or lower extremity symptoms. No vision changes.  No history of hypertension diabetes. No headache or neck pain or recent injuries. He is a smoker. Family history of a stroke in his father in his 63s.    Past Medical History  Diagnosis Date  . Asthma   . Seasonal allergic rhinitis   . Wears glasses   . Migraines     usually stress related, no frequent headaches since 04/2014  . GERD (gastroesophageal reflux disease)    Past Surgical History  Procedure Laterality Date  . Anterior cervical decomp/discectomy fusion  2013    Dr. Yevette Edwards  . Medial collateral ligament repair, knee  2011    left  . Shoulder surgery  08/2013    left, labrum repair, RTC repair, spur removal; [place]  . Inguinal hernia repair  2003    right  . Nasal sinus surgery      x2  . Colonoscopy  38    due to stomach issues, normal, [place]   Family History  Problem Relation Age of Onset  . Diabetes Mother   . Heart disease Father 32    MI  . Kidney disease Sister   . Diabetes Brother   . Cancer Neg Hx    Social History  Substance Use Topics  . Smoking status: Never Smoker   . Smokeless tobacco: Not on file  . Alcohol Use: No    Review of Systems  Constitutional: Negative for fever, chills, diaphoresis, appetite change and fatigue.  HENT: Negative for  mouth sores, sore throat and trouble swallowing.   Eyes: Negative for visual disturbance.  Respiratory: Negative for cough, chest tightness, shortness of breath and wheezing.   Cardiovascular: Negative for chest pain.  Gastrointestinal: Negative for nausea, vomiting, abdominal pain, diarrhea and abdominal distention.  Endocrine: Negative for polydipsia, polyphagia and polyuria.  Genitourinary: Negative for dysuria, frequency and hematuria.  Musculoskeletal: Negative for gait problem.  Skin: Negative for color change, pallor and rash.  Neurological: Positive for facial asymmetry. Negative for dizziness, syncope, light-headedness and headaches.  Hematological: Does not bruise/bleed easily.  Psychiatric/Behavioral: Negative for behavioral problems and confusion.      Allergies  Review of patient's allergies indicates no known allergies.  Home Medications   Prior to Admission medications   Medication Sig Start Date End Date Taking? Authorizing Provider  Albuterol Sulfate (PROAIR RESPICLICK) 108 (90 BASE) MCG/ACT AEPB Inhale 2 puffs into the lungs every 6 (six) hours as needed. Patient taking differently: Inhale 2 puffs into the lungs every 6 (six) hours as needed (for shortness of breath).  01/15/15  Yes Kermit Balo Tysinger, PA-C  artificial tears (LACRILUBE) OINT ophthalmic ointment Place into the right eye every 3 (three) hours as needed for dry eyes. 05/12/15   Loraine Leriche  Fayrene Fearing, MD  predniSONE (DELTASONE) 10 MG tablet 1 by mouth twice a day 5 days, then daily 5 days. 05/12/15   Rolland Porter, MD  valACYclovir (VALTREX) 1000 MG tablet Take 1 tablet (1,000 mg total) by mouth 3 (three) times daily. 05/12/15 05/26/15  Rolland Porter, MD   BP 119/77 mmHg  Pulse 65  Temp(Src) 98.1 F (36.7 C) (Oral)  Resp 17  SpO2 100% Physical Exam  Constitutional: He is oriented to person, place, and time. He appears well-developed and well-nourished. No distress.  HENT:  Head: Normocephalic.  Eyes: Conjunctivae are  normal. Pupils are equal, round, and reactive to light. No scleral icterus.  Neck: Normal range of motion. Neck supple. No thyromegaly present.  Cardiovascular: Normal rate and regular rhythm.  Exam reveals no gallop and no friction rub.   No murmur heard. Pulmonary/Chest: Effort normal and breath sounds normal. No respiratory distress. He has no wheezes. He has no rales.  Abdominal: Soft. Bowel sounds are normal. He exhibits no distension. There is no tenderness. There is no rebound.  Musculoskeletal: Normal range of motion.  Neurological: He is alert and oriented to person, place, and time.  Subtle right lower facial weakness. Reports decreased sensation throughout the upper and lower face on the right. Does not have Bell's phenomenon or upper facial weakness on initial exam.  Skin: Skin is warm and dry. No rash noted.  Psychiatric: He has a normal mood and affect. His behavior is normal.    ED Course  Procedures (including critical care time) Labs Review Labs Reviewed  DIFFERENTIAL - Abnormal; Notable for the following:    Eosinophils Relative 11 (*)    All other components within normal limits  COMPREHENSIVE METABOLIC PANEL - Abnormal; Notable for the following:    Glucose, Bld 117 (*)    ALT 15 (*)    All other components within normal limits  CBG MONITORING, ED - Abnormal; Notable for the following:    Glucose-Capillary 114 (*)    All other components within normal limits  I-STAT CHEM 8, ED - Abnormal; Notable for the following:    Glucose, Bld 112 (*)    All other components within normal limits  PROTIME-INR  APTT  CBC  I-STAT TROPOININ, ED    Imaging Review No results found. I have personally reviewed and evaluated these images and lab results as part of my medical decision-making.   EKG Interpretation   Date/Time:  Monday May 12 2015 16:20:53 EDT Ventricular Rate:  78 PR Interval:  156 QRS Duration: 94 QT Interval:  376 QTC Calculation: 428 R Axis:    86 Text Interpretation:  Normal sinus rhythm Normal ECG ED PHYSICIAN  INTERPRETATION AVAILABLE IN [facility] Confirmed by TEST, Record  ([id number]) on 05/13/2015 7:34:57 AM      MDM   Final diagnoses:  Bell's palsy    Patient seen and extensively evaluated by myself. As seen in consult by Dr. Hosie Poisson of neurology. MRI shows no focal abnormalities. On reexam he does have more evident upper facial weakness as well as lower. Plan will be treatment for Bell's palsy neurological follow-up and primary care follow-up if not improving. Given first dose of Valtrex, and prednisone.    Rolland Porter, MD 05/17/15 [phone]

## 2015-05-12 NOTE — Code Documentation (Signed)
44yo male arriving to [facility] via private vehicle at [phone].  Patient reports that he was in the kitchen reaching into the cabinet when he noticed a popping sensation in his ear.  He then noticed his right face to be numb and felt like his right eye would not blink.  Code Stroke called on patient arrival to the ED.  Patient to CT.  Stroke team to the bedside.  NIHSS 2, see documentation for details and code stroke times.  Patient with slight right facial droop and right face with decreased sensation.  Dr. Hosie Poisson at the bedside.  Patient's symptoms are too mild to treat with tPA at this time, however, he remains in the window to treat with tPA until 1830.  Bedside handoff with ED RN Shawna Orleans.

## 2015-05-12 NOTE — ED Notes (Signed)
Pt A&Ox4, ambulatory at d/c with steady gait, NAD 

## 2015-05-28 ENCOUNTER — Ambulatory Visit (INDEPENDENT_AMBULATORY_CARE_PROVIDER_SITE_OTHER): Payer: Managed Care, Other (non HMO) | Admitting: Diagnostic Neuroimaging

## 2015-05-28 ENCOUNTER — Encounter: Payer: Self-pay | Admitting: *Deleted

## 2015-05-28 VITALS — BP 113/70 | HR 65 | Ht 71.0 in | Wt 205.0 lb

## 2015-05-28 DIAGNOSIS — H9201 Otalgia, right ear: Secondary | ICD-10-CM

## 2015-05-28 DIAGNOSIS — G510 Bell's palsy: Secondary | ICD-10-CM | POA: Diagnosis not present

## 2015-05-28 NOTE — Patient Instructions (Signed)
Move your body often!  Eat more plants, vegetables, nuts, seeds, berries (blueberry, blackberry, raspberry).  Eat less processed, fried, salty and sugary foods.   Try to get at least 7-8+ hours sleep per day.  Use ibuprofen or tylenol for pain relief over next 2-4 weeks.

## 2015-05-28 NOTE — Progress Notes (Signed)
[facility]  PATIENT: Mark Arroyo DOB: March 26, 1971  REFERRING CLINICIAN: ER / hosp admission referral HISTORY FROM: patient and wife  REASON FOR VISIT: new consult    HISTORICAL  CHIEF COMPLAINT:  Chief Complaint  Patient presents with  . Bell's Palsey    rm 6, New Patient, wife- Ayesha    HISTORY OF PRESENT ILLNESS:   44 year old right-handed male here for evaluation of Bell's palsy.  Labor Day 2016 patient noticed onset of strange sensation in the right ear and right jaw. Within a day or 2 he noticed significant asymmetry of his right face. On 05/12/15 patient went to the emergency room for evaluation. Initially closed stroke was activated but then canceled. CT scan of the head was unremarkable. MRI of the brain showed no acute findings. He was diagnosed with right Bell's palsy and treated with valacyclovir and prednisone.  Since discharge patient symptoms have gradually improved. He still has some mild weakness on the right side. He also has some fullness and pain sensation behind his right ear.  No change in hearing or taste.  Patient has history of cervical discectomy and fusion as well as extensive sinus surgeries in the past.  Patient also has history of migraine headaches since age 89 years old.  No prodromal viral infection symptoms, traumas or other factors before onset of right facial weakness.    REVIEW OF SYSTEMS: Full 14 system review of systems performed and notable only for headache numbness.  ALLERGIES: No Known Allergies  HOME MEDICATIONS: Outpatient Prescriptions Prior to Visit  Medication Sig Dispense Refill  . Albuterol Sulfate (PROAIR RESPICLICK) 108 (90 BASE) MCG/ACT AEPB Inhale 2 puffs into the lungs every 6 (six) hours as needed. (Patient taking differently: Inhale 2 puffs into the lungs every 6 (six) hours as needed (for shortness of breath). ) 1 each 0  . artificial tears (LACRILUBE) OINT ophthalmic ointment Place into  the right eye every 3 (three) hours as needed for dry eyes. 10 g 0  . predniSONE (DELTASONE) 10 MG tablet 1 by mouth twice a day 5 days, then daily 5 days. 15 tablet 0   No facility-administered medications prior to visit.    PAST MEDICAL HISTORY: Past Medical History  Diagnosis Date  . Asthma   . Seasonal allergic rhinitis   . Wears glasses   . GERD (gastroesophageal reflux disease)   . Migraines     as adult, age 8, usually stress related, no frequent headaches since 04/2014    PAST SURGICAL HISTORY: Past Surgical History  Procedure Laterality Date  . Anterior cervical decomp/discectomy fusion  2013    Dr. Yevette Edwards  . Medial collateral ligament repair, knee  2011    left  . Shoulder surgery Left 08/2013    left, labrum repair, RTC repair, spur removal; [place]  . Inguinal hernia repair  2003    right  . Nasal sinus surgery      x2  . Colonoscopy  38    due to stomach issues, normal, [place]  . Vasectomy  2013    FAMILY HISTORY: Family History  Problem Relation Age of Onset  . Diabetes Mother   . Heart disease Father 53    MI  . Kidney disease Sister   . Diabetes Brother   . Cancer Neg Hx     SOCIAL HISTORY:  Social History   Social History  . Marital Status: Married    Spouse Name: Gust Rung  . Number of Children: 4  .  Years of Education: 14   Occupational History  .      [employer] [employer] de[employer]   Social History Main Topics  . Smoking status: Never Smoker   . Smokeless tobacco: Not on file  . Alcohol Use: No  . Drug Use: No  . Sexual Activity: Not on file   Other Topics Concern  . 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  Christian.  Hobbies - likes basketball, video games      Caffeine use- varies, soda and tea occ, not daily     PHYSICAL EXAM  GENERAL EXAM/CONSTITUTIONAL: Vitals:  Filed Vitals:    05/28/15 0916  BP: 113/70  Pulse: 65  Height:  (1.803 m)  Weight: 205 lb (92.987 kg)     Body mass index is 28.6 kg/(m^2).  Visual Acuity Screening   Right eye Left eye Both eyes  Without correction: 20/50 20/40   With correction:     Comments: 05/28/15 has glasses but not with him today    Patient is in no distress; well developed, nourished and groomed; neck is supple  TYMPANIC MEMBRANES NORMAL; EXT AUDITORY CANALS NORMAL  CARDIOVASCULAR:  Examination of carotid arteries is normal; no carotid bruits  Regular rate and rhythm, no murmurs  Examination of peripheral vascular system by observation and palpation is normal  EYES:  Ophthalmoscopic exam of optic discs and posterior segments is normal; no papilledema or hemorrhages  MUSCULOSKELETAL:  Gait, strength, tone, movements noted in Neurologic exam below  NEUROLOGIC: MENTAL STATUS:  No flowsheet data found.  awake, alert, oriented to person, place and time  recent and remote memory intact  normal attention and concentration  language fluent, comprehension intact, naming intact,   fund of knowledge appropriate  CRANIAL NERVE:   2nd - no papilledema on fundoscopic exam  2nd, 3rd, 4th, 6th - pupils equal and reactive to light, visual fields full to confrontation, extraocular muscles intact, no nystagmus  5th - facial sensation --> DECR IN RIGHT V1, V2, V3  7th - facial strength --> DECR RIGHT UPPER AND RIGHT LOWER FACIAL STRENGTH  8th - hearing intact  9th - palate elevates symmetrically, uvula midline  11th - shoulder shrug symmetric  12th - tongue protrusion midline  MOTOR:   normal bulk and tone, full strength in the BUE, BLE  SENSORY:   normal and symmetric to light touch, pinprick, temperature, vibration and proprioception  COORDINATION:   finger-nose-finger, fine finger movements, heel-shin normal  REFLEXES:   deep tendon reflexes present and symmetric  GAIT/STATION:    narrow based gait; able to walk on toes, heels and tandem; romberg is negative    DIAGNOSTIC DATA (LABS, IMAGING, TESTING) - I reviewed patient records, labs, notes, testing and imaging myself where available.  Lab Results  Component Value Date   WBC 5.8 05/12/2015   HGB 16.0 05/12/2015   HCT 47.0 05/12/2015   MCV 81.9 05/12/2015   PLT 199 05/12/2015      Component Value Date/Time   NA 140 05/12/2015 1635   K 3.9 05/12/2015 1635   CL 102 05/12/2015 1635   CO2 28 05/12/2015 1626   GLUCOSE 112* 05/12/2015 1635   BUN 10 05/12/2015 1635   CREATININE 1.10 05/12/2015 1635   CREATININE 0.92 01/15/2015 1051   CALCIUM 9.4 05/12/2015 1626   PROT 7.1 05/12/2015 1626  ALBUMIN 3.8 05/12/2015 1626   AST 26 05/12/2015 1626   ALT 15* 05/12/2015 1626   ALKPHOS 58 05/12/2015 1626   BILITOT 1.1 05/12/2015 1626   GFRNONAA >60 05/12/2015 1626   GFRAA >60 05/12/2015 1626   Lab Results  Component Value Date   CHOL 198 01/15/2015   HDL 49 01/15/2015   LDLCALC 130* 01/15/2015   TRIG 97 01/15/2015   CHOLHDL 4.0 01/15/2015   Lab Results  Component Value Date   HGBA1C 6.1* 01/15/2015   No results found for: [id number] Lab Results  Component Value Date   TSH 1.804 01/15/2015    05/12/15 CT head  1.No evidence for acute intracranial abnormality. 2. Sinus disease. Probable chronic fractures of the left orbit and sinuses. Critical Value/emergent results were called by telephone at the time of interpretation on 05/12/2015 at 4:43 pm to Dr. Hosie Poisson, who verbally acknowledged these results.  05/12/15 MRI brain [I reviewed images myself and agree with interpretation. -VRP]  - Negative for acute infarct. - Solitary hyperintensity left sub insular white matter. Differential includes chronic ischemia and demyelinating disease. - Extensive chronic sinusitis.     ASSESSMENT AND PLAN  44 y.o. year old male here with right peripheral facial nerve weakness starting beginning of September 2016,  most consistent with Bell's palsy. Patient has had some recovery. Hopefully symptoms will continue to improve over the next few weeks and months.  Dx:  Right-sided Bell's palsy  Right ear pain    PLAN: - monitor symptoms  Return in about 6 weeks (around 07/09/2015).    Suanne Marker, MD 05/28/2015, 10:19 AM Certified in Neurology, Neurophysiology and Neuroimaging  [facility] [address], [address] [place], [place] [address] [phone]

## 2015-06-25 ENCOUNTER — Other Ambulatory Visit: Payer: Self-pay | Admitting: Otolaryngology

## 2015-06-25 HISTORY — PX: NASAL SINUS SURGERY: SHX719

## 2015-07-14 ENCOUNTER — Telehealth: Payer: Self-pay | Admitting: Diagnostic Neuroimaging

## 2015-07-14 NOTE — Telephone Encounter (Signed)
Spoke with patient's wife who states her husband "would like to know if there's anything further to be done". She states he would like another neurologist's opinion in this same practice, and he is requesting his appointment this Wed be changed to another provider. Informed her would discuss with Dr Marjory LiesPenumalli and call her back by close tomorrow. She verbalized understanding, appreciation.

## 2015-07-14 NOTE — Telephone Encounter (Signed)
Pt's wife called sts pt is requesting to see another provider in the practice and is requesting a 2nd opinion. Please call and advise at [phone].

## 2015-07-14 NOTE — Telephone Encounter (Signed)
I called patient, no there. I spoke with his wife and answered her questions. We agreed to meet on wed appt to discuss further. If second opinion is still desired, then [facility]. -VRP

## 2015-07-16 ENCOUNTER — Encounter: Payer: Self-pay | Admitting: Diagnostic Neuroimaging

## 2015-07-16 ENCOUNTER — Ambulatory Visit (INDEPENDENT_AMBULATORY_CARE_PROVIDER_SITE_OTHER): Payer: Managed Care, Other (non HMO) | Admitting: Diagnostic Neuroimaging

## 2015-07-16 VITALS — BP 108/70 | HR 74 | Ht 71.0 in | Wt 202.0 lb

## 2015-07-16 DIAGNOSIS — S0451XS Injury of facial nerve, right side, sequela: Secondary | ICD-10-CM

## 2015-07-16 DIAGNOSIS — G510 Bell's palsy: Secondary | ICD-10-CM | POA: Diagnosis not present

## 2015-07-16 NOTE — Addendum Note (Signed)
Addended byJoycelyn Schmid: PENUMALLI, VIKRAM on: 07/16/2015 10:45 AM   Modules accepted: Orders

## 2015-07-16 NOTE — Patient Instructions (Signed)
[facility] you for coming to see [facility]. I hope we have been able to provide you high quality care today.  You may receive a patient satisfaction survey over the next few weeks. We would appreciate your feedback and comments so that we may continue to improve ourselves and the health of our patients.  - I will check MRI brain - follow up with eye doctor

## 2015-07-16 NOTE — Progress Notes (Signed)
[facility]  PATIENT: Mark Arroyo 1971-07-17  REFERRING CLINICIAN: ER / hosp admission referral HISTORY FROM: patient and wife  REASON FOR VISIT: new consult    HISTORICAL  CHIEF COMPLAINT:  Chief Complaint  Patient presents with  . Bell's Palsy    rm 6  . Follow-up    6 week    HISTORY OF PRESENT ILLNESS:   UPDATE 07/16/15: Since last visit, symptoms were improving, then started to worsen. More trouble with chewing, tongue, and eye closure issues. Pain in right ear is resolved since sinus surgery.  PRIOR HPI (05/28/15): 44 year old right-handed male here for evaluation of Bell's palsy. Labor Day 2016 patient noticed onset of strange sensation in the right ear and right jaw. Within a day or 2 he noticed significant asymmetry of his right face. On 05/12/15 patient went to the emergency room for evaluation. Initially closed stroke was activated but then canceled. CT scan of the head was unremarkable. MRI of the brain showed no acute findings. He was diagnosed with right Bell's palsy and treated with valacyclovir and prednisone. Since discharge patient symptoms have gradually improved. He still has some mild weakness on the right side. He also has some fullness and pain sensation behind his right ear. No change in hearing or taste. Patient has history of cervical discectomy and fusion as well as extensive sinus surgeries in the past. Patient also has history of migraine headaches since age 30 years old. No prodromal viral infection symptoms, traumas or other factors before onset of right facial weakness.    REVIEW OF SYSTEMS: Full 14 system review of systems performed and notable only for headache numbness.  ALLERGIES: No Known Allergies  HOME MEDICATIONS: Outpatient Prescriptions Prior to Visit  Medication Sig Dispense Refill  . Albuterol Sulfate (PROAIR RESPICLICK) 108 (90 BASE) MCG/ACT AEPB Inhale 2 puffs into the lungs every 6 (six) hours as needed.  (Patient taking differently: Inhale 2 puffs into the lungs every 6 (six) hours as needed (for shortness of breath). ) 1 each 0  . artificial tears (LACRILUBE) OINT ophthalmic ointment Place into the right eye every 3 (three) hours as needed for dry eyes. 10 g 0  . ibuprofen (ADVIL,MOTRIN) 200 MG tablet Take 200 mg by mouth every 6 (six) hours as needed.    . predniSONE (STERAPRED UNI-PAK 21 TAB) 10 MG (21) TBPK tablet      No facility-administered medications prior to visit.    PAST MEDICAL HISTORY: Past Medical History  Diagnosis Date  . Asthma   . Seasonal allergic rhinitis   . Wears glasses   . GERD (gastroesophageal reflux disease)   . Migraines     as adult, age 44, usually stress related, no frequent headaches since 04/2014    PAST SURGICAL HISTORY: Past Surgical History  Procedure Laterality Date  . Anterior cervical decomp/discectomy fusion  2013    Dr. Yevette Edwards  . Medial collateral ligament repair, knee  2011    left  . Shoulder surgery Left 08/2013    left, labrum repair, RTC repair, spur removal; [place]  . Inguinal hernia repair  2003    right  . Nasal sinus surgery  06/25/15    x2,   . Colonoscopy  38    due to stomach issues, normal, [place]  . Vasectomy  2013    FAMILY HISTORY: Family History  Problem Relation Age of Onset  . Diabetes Mother   . Heart disease Father 39    MI  .  Kidney disease Sister   . Diabetes Brother   . Cancer Neg Hx     SOCIAL HISTORY:  Social History   Social History  . Marital Status: Married    Spouse Name: Gust Rung  . Number of Children: 4  . Years of Education: 14   Occupational History  .      [employer] dept   Social History Main Topics  . Smoking status: Never Smoker   . Smokeless tobacco: Not on file  . Alcohol Use: No  . Drug Use: No  . Sexual Activity: Not on file   Other Topics Concern  . 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  Christian.  Hobbies - likes basketball, video games      Caffeine use- varies, soda and tea occ, not daily     PHYSICAL EXAM  GENERAL EXAM/CONSTITUTIONAL: Vitals:  Filed Vitals:   07/16/15 0954  BP: 108/70  Pulse: 74  Height:  (1.803 m)  Weight: 202 lb (91.627 kg)   Body mass index is 28.19 kg/(m^2). No exam data present  Patient is in no distress; well developed, nourished and groomed; neck is supple  TYMPANIC MEMBRANES NORMAL; EXT AUDITORY CANALS NORMAL  CARDIOVASCULAR:  Examination of carotid arteries is normal; no carotid bruits  Regular rate and rhythm, no murmurs  Examination of peripheral vascular system by observation and palpation is normal  EYES:  Ophthalmoscopic exam of optic discs and posterior segments is normal; no papilledema or hemorrhages  MUSCULOSKELETAL:  Gait, strength, tone, movements noted in Neurologic exam below  NEUROLOGIC: MENTAL STATUS:  No flowsheet data found.  awake, alert, oriented to person, place and time  recent and remote memory intact  normal attention and concentration  language fluent, comprehension intact, naming intact,   fund of knowledge appropriate  CRANIAL NERVE:   2nd - no papilledema on fundoscopic exam  2nd, 3rd, 4th, 6th - pupils equal and reactive to light, visual fields full to confrontation, extraocular muscles intact, no nystagmus  5th - facial sensation --> DECR IN RIGHT V1, V2, V3  7th - facial strength --> DECR RIGHT UPPER AND RIGHT LOWER FACIAL STRENGTH; ABLE TO RAISE BROW AND CLOSE EYE, BUT NOT AS COMPLETELY AS LEFT SIDE. DECR SMILE ON RIGHT.   8th - hearing intact  9th - palate elevates symmetrically, uvula midline  11th - shoulder shrug symmetric  12th - tongue protrusion midline  MOTOR:   normal bulk and tone, full strength in the BUE, BLE  SENSORY:   normal and symmetric to light touch, temperature,  vibration   COORDINATION:   finger-nose-finger, fine finger movements normal  REFLEXES:   deep tendon reflexes present and symmetric  GAIT/STATION:   narrow based gait    DIAGNOSTIC DATA (LABS, IMAGING, TESTING) - I reviewed patient records, labs, notes, testing and imaging myself where available.  Lab Results  Component Value Date   WBC 5.8 05/12/2015   HGB 16.0 05/12/2015   HCT 47.0 05/12/2015   MCV 81.9 05/12/2015   PLT 199 05/12/2015      Component Value Date/Time   NA 140 05/12/2015 1635   K 3.9 05/12/2015 1635   CL 102 05/12/2015 1635   CO2 28 05/12/2015 1626   GLUCOSE 112* 05/12/2015 1635   BUN 10 05/12/2015 1635   CREATININE 1.10 05/12/2015 1635  CREATININE 0.92 01/15/2015 1051   CALCIUM 9.4 05/12/2015 1626   PROT 7.1 05/12/2015 1626   ALBUMIN 3.8 05/12/2015 1626   AST 26 05/12/2015 1626   ALT 15* 05/12/2015 1626   ALKPHOS 58 05/12/2015 1626   BILITOT 1.1 05/12/2015 1626   GFRNONAA >60 05/12/2015 1626   GFRAA >60 05/12/2015 1626   Lab Results  Component Value Date   CHOL 198 01/15/2015   HDL 49 01/15/2015   LDLCALC 130* 01/15/2015   TRIG 97 01/15/2015   CHOLHDL 4.0 01/15/2015   Lab Results  Component Value Date   HGBA1C 6.1* 01/15/2015   No results found for: [id number] Lab Results  Component Value Date   TSH 1.804 01/15/2015    05/12/15 CT head  1.No evidence for acute intracranial abnormality. 2. Sinus disease. Probable chronic fractures of the left orbit and sinuses. Critical Value/emergent results were called by telephone at the time of interpretation on 05/12/2015 at 4:43 pm to Dr. Hosie PoissonSumner, who verbally acknowledged these results.  05/12/15 MRI brain [I reviewed images myself and agree with interpretation. -VRP]  - Negative for acute infarct. - Solitary hyperintensity left sub insular white matter. Differential includes chronic ischemia and demyelinating disease. - Extensive chronic sinusitis.     ASSESSMENT AND PLAN  44 y.o. year  old male here with right peripheral facial nerve weakness starting beginning of September 2016, most consistent with Bell's palsy. Patient has had some recovery initially, but now symptoms seem to be worsening. WIll check MRI brain and IAC protocol to rule out other causes of facial nerve injury.    Dx:  Injury of right facial nerve, sequela - Plan: MR Brain/IAC Wo/W Cm  Right-sided Bell's palsy - Plan: MR Brain/IAC Wo/W Cm    PLAN: - check MRI brain and IAC (worsening right sided weakness) - follow up with eye doctor; eye care issues reviewed - monitor symptoms  Return in about 3 months (around 10/16/2015).    Suanne MarkerVIKRAM R. Lizmary Nader, MD 07/16/2015, 10:35 AM Certified in Neurology, Neurophysiology and Neuroimaging  [facility] [address], [address] [place], [place] [id number] [phone]

## 2015-07-23 ENCOUNTER — Ambulatory Visit (INDEPENDENT_AMBULATORY_CARE_PROVIDER_SITE_OTHER): Payer: Managed Care, Other (non HMO) | Admitting: Medical

## 2015-07-23 ENCOUNTER — Encounter: Payer: Self-pay | Admitting: Medical

## 2015-07-23 VITALS — BP 104/80 | HR 82 | Wt 205.0 lb

## 2015-07-23 DIAGNOSIS — R29810 Facial weakness: Secondary | ICD-10-CM

## 2015-07-23 DIAGNOSIS — J4530 Mild persistent asthma, uncomplicated: Secondary | ICD-10-CM

## 2015-07-23 DIAGNOSIS — G510 Bell's palsy: Secondary | ICD-10-CM

## 2015-07-23 DIAGNOSIS — J328 Other chronic sinusitis: Secondary | ICD-10-CM

## 2015-07-23 MED ORDER — ALBUTEROL SULFATE HFA 108 (90 BASE) MCG/ACT IN AERS: 2.0000 | INHALATION_SPRAY | Freq: Four times a day (QID) | RESPIRATORY_TRACT | Status: DC | PRN

## 2015-07-23 NOTE — Progress Notes (Signed)
Subjective: Chief Complaint  Patient presents with  . med check    the inhaler is a little tough to use right now from still being numb. was diagnosed on labor day. neurologist, [facility]. said he doesnt like him, ent doctor is dr gorr. 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 allergist he will start seeing 11/21. said nothing is getting better to him on 12/7 he is having mri to see if anything is on his nerve in his face. Wants a new neurologist    Here for multiple concerns.  He was in usual state of health at his physical in May and not having much asthma problems, but few months ago went to the ED, diagnosed with bells palsy after having facial weakness.   He initially saw neurology but wasn't happy with the visit.  Felt like questions weren't answered.  He wants referral to a different neurologist.   He notes that with the first neurology visit was having increased facial pain and pressure, ended up seeing ENT, had abnormal MRI showing chronic sinusitis, and had sinus surgery.  Overall doing better with facial pain and pressure.  He is mainly concerns about ongoing right facial weakness.   Saw neurology in f/u but didn't feel good about the appt or advice.  He is awaiting referral to physical therapy, but hasn't heard back from neuro.   Wants second opinion in general.    He was referred by ENT to allergists given the pathology findings and chronic sinusitis.   He will be seeing them soon.  Currently using Dymista sprays at the recommendation of ENT.   He notes some recent exercise induced asthma, using inhaler a few times per week but not having luck with the Pro air Respiclick.  Given the facial weakness, unable to close fully around the inhaler, and the force of the mist with the respiclick is not as strong as the proair.  Wants to go back to proair.     Past Medical History  Diagnosis Date  . Asthma   . Seasonal allergic rhinitis   . Wears glasses   . GERD (gastroesophageal reflux  disease)   . Migraines     as adult, age 44, usually stress related, no frequent headaches since 04/2014   ROS as in subjective  Objective: BP 104/80 mmHg  Pulse 82  Wt 205 lb (92.987 kg)  SpO2 97%  Gen: wd, wn, nad Right facial weakness of right cheek and lower right face, lesser extent right forehead, unable to close right eye actively, otherwise CN2-12 intact, rest of neuro nonfocal    Assessment: Encounter Diagnoses  Name Primary?  . Bell's palsy Yes  . Asthma, mild persistent, uncomplicated   . Facial weakness   . Other chronic sinusitis     Plan: Bells palsy - reviewed ED notes, neurology notes.   Discussed the weakness, and that sometimes this can be permanent.   discussed his concerns.   He will call neurology back about the pending physical therapy referral.  Will make referral to [facility] for second opinion at his request, but advised that next priority is to see the allergist as scheduled and to start PT.     Asthma - change to proair with spacer.  discussed proper use of medication.   If not improving or if having to use the inhaler more frequently, then needs to return to discussed other preventative medications.  Chronic sinusitis - c/t plan to see allergies, f/u with ENT as planned, s/p  sinus surgery in 05/2015  Spent the majority of the time discussed his eval and management up to this point, recommendations for next steps, and discussion potential complications of bells palsy.    F/u pending neurology referral.   F/u 3-4 wk on asthma

## 2015-07-23 NOTE — Addendum Note (Signed)
Addended by: Jac CanavanYSINGER, Keiandre Cygan S on: 07/23/2015 04:11 PM   Modules accepted: Level of Service

## 2015-07-29 ENCOUNTER — Encounter: Payer: Self-pay | Admitting: Physical Therapy

## 2015-07-29 ENCOUNTER — Ambulatory Visit: Payer: Managed Care, Other (non HMO) | Attending: Diagnostic Neuroimaging | Admitting: Physical Therapy

## 2015-07-29 DIAGNOSIS — R29810 Facial weakness: Secondary | ICD-10-CM | POA: Diagnosis not present

## 2015-07-29 NOTE — Patient Instructions (Signed)
Facial Exercise: Eyebrow Raise    Raise eyebrows. Hold _3-5___ seconds. Relax. Repeat __10_reps  per session. Do _1-2___ sessions per day.  Copyright  VHI. All rights reserved.  Facial Exercise: Eyebrow Frown    Bring eyebrows together in a frown.Hold _3-5___ seconds. Relax. Repeat __10_reps  per session. Do _1-2___ sessions per day.  Copyright  VHI. All rights reserved.  Facial Exercise: Eyebrow Frown     Copyright  VHI. All rights reserved.  Facial Exercise: Lower Lip    Push lower lip forward. Hold _3-5___ seconds. Relax. Repeat __10_reps  per session. Do _1-2___ sessions per day.  Copyright  VHI. All rights reserved.  Facial Exercise: Nose Wrinkle    Wrinkle nose. Hold _3-5___ seconds. Relax. Repeat __10_reps  per session. Do _1-2___ sessions per day.  Copyright  VHI. All rights reserved.  Facial Exercise: Pursed Lips    Suck in cheeks and push lips forward. Hold _3-5___ seconds. Relax. Repeat __10_reps  per session. Do _1-2___ sessions per day.  Copyright  VHI. All rights reserved.  Facial Exercise: Smile    Turn up corners of mouth. Hold _3-5___ seconds. Relax. Repeat __10_reps  per session. Do _1-2___ sessions per day.  Copyright  VHI. All rights reserved.  Facial Exercise: Squint    Squeeze eyes tightly shut. Hold _3-5___ seconds. Relax. Repeat __10_reps  per session. Do _1-2___ sessions per day.  Copyright  VHI. All rights reserved.  Facial Exercise: Upper Lip    Push upper lip forward. Hold _3-5___ seconds. Relax. Repeat __10_reps  per session. Do _1-2___ sessions per day.  Copyright  VHI. All rights reserved.

## 2015-07-29 NOTE — Therapy (Signed)
[facility] [address] [place], [place], [address] Phone: [phone]   Fax:  [phone]  Physical Therapy Evaluation  Patient Details  Name: Mark Arroyo MRN: [id number] Date of Birth: [birth year] Referring Provider: Andrey Spearman  Encounter Date: 07/29/2015      PT End of Session - 07/29/15 1030    Visit Number 1   Number of Visits 1   PT Start Time 0935   PT Stop Time 1005   PT Time Calculation (min) 30 min   Equipment Utilized During Treatment Gait belt   Activity Tolerance Patient tolerated treatment well   Behavior During Therapy [facility] for tasks assessed/performed      Past Medical History  Diagnosis Date  . Asthma   . Seasonal allergic rhinitis   . Wears glasses   . GERD (gastroesophageal reflux disease)   . Migraines     as adult, age 38, usually stress related, no frequent headaches since 04/2014    Past Surgical History  Procedure Laterality Date  . Anterior cervical decomp/discectomy fusion  2013    Dr. Lynann Bologna  . Medial collateral ligament repair, knee  2011    left  . Shoulder surgery Left 08/2013    left, labrum repair, RTC repair, spur removal; [place]  . Inguinal hernia repair  2003    right  . Nasal sinus surgery  06/25/15    x2,   . Colonoscopy  38    due to stomach issues, normal, [place]  . Vasectomy  2013    There were no vitals filed for this visit.  Visit Diagnosis:  Facial weakness      Subjective Assessment - 07/29/15 0940    Subjective On 05/12/2015 he had sensation changes to right side of face. He went to ED and was diagnosed with Bell's Palsy. He did develop right facial weakness over next 2 days. He was referred to PT for evaluation & treatment.    Patient Stated Goals Know what exercises will help my condition.    Currently in Pain? No/denies            [facility] PT Assessment - 07/29/15 0930    Assessment   Medical Diagnosis Bell's Palsy   Referring Provider Andrey Spearman   Onset Date/Surgical Date 05/12/15   Precautions   Precautions None   Balance Screen   Has the patient fallen in the past 6 months No   Has the patient had a decrease in activity level because of a fear of falling?  No   Is the patient reluctant to leave their home because of a fear of falling?  No   Home Environment   Living Environment Private residence   Living Arrangements Spouse/significant other;Children  12yo, 13yo & 6yo   Type of [place] Access Level entry   [place] to live on main level with bedroom/bathroom;Two level   ROM / Strength   AROM / PROM / Strength AROM;Strength   AROM   Overall AROM  Within functional limits for tasks performed   Overall AROM Comments UEs & cervical WNL   Strength   Overall Strength Comments Right side facial weakness with minimal muscle movement noted for: closing eyes (right closes but not firm due to contraction), unable to wink with right eye, lower & upper lip projection, nose wrinkling, pursed lips, smile and squint.    Strength Assessment Site Other (comment)  face  PT Long Term Goals - 07/29/15 1035    PT LONG TERM GOAL #1   Title Patient demonstrates & verbalizes understanding of HEP for facial muscles.    Baseline MET on day of evaluation.    Status Achieved               Plan - 07/29/15 1030    Clinical Impression Statement This 44yo has facial weakness associated with Bell's Palsy. He was instructed in facial exercises for HEP and he was able to return demonstration. No further PT appears indicated at this time.    Pt will benefit from skilled therapeutic intervention in order to improve on the following deficits Decreased strength   Rehab Potential Good   PT Frequency One time visit   PT Treatment/Interventions Therapeutic exercise;Patient/family education   PT Home Exercise Plan PT instructed in HEP of facial  exercises in front of mirror   Consulted and Agree with Plan of Care Patient         Problem List Patient Active Problem List   Diagnosis Date Noted  . Asthma, mild intermittent 01/15/2015    Eulice Rutledge PT, DPT 07/29/2015, 10:52 AM  [facility] [address] [place], [place], [address] Phone: [phone]   Fax:  [phone]  Name: Mark Arroyo MRN: [id number] Date of Birth: 30-Jan-1971

## 2015-08-13 ENCOUNTER — Ambulatory Visit (INDEPENDENT_AMBULATORY_CARE_PROVIDER_SITE_OTHER): Payer: Managed Care, Other (non HMO)

## 2015-08-13 DIAGNOSIS — G510 Bell's palsy: Secondary | ICD-10-CM

## 2015-08-13 DIAGNOSIS — S0451XS Injury of facial nerve, right side, sequela: Secondary | ICD-10-CM

## 2015-08-13 MED ORDER — GADOPENTETATE DIMEGLUMINE 469.01 MG/ML IV SOLN: 20.0000 mL | Freq: Once | INTRAVENOUS | Status: AC | PRN

## 2015-09-03 ENCOUNTER — Ambulatory Visit: Payer: Managed Care, Other (non HMO) | Admitting: Neurology

## 2015-10-17 ENCOUNTER — Ambulatory Visit: Payer: Managed Care, Other (non HMO) | Admitting: Diagnostic Neuroimaging

## 2015-11-21 ENCOUNTER — Ambulatory Visit (INDEPENDENT_AMBULATORY_CARE_PROVIDER_SITE_OTHER): Payer: Managed Care, Other (non HMO) | Admitting: Diagnostic Neuroimaging

## 2015-11-21 ENCOUNTER — Encounter: Payer: Self-pay | Admitting: Diagnostic Neuroimaging

## 2015-11-21 VITALS — BP 111/67 | HR 64 | Ht 71.0 in | Wt 217.0 lb

## 2015-11-21 DIAGNOSIS — H9201 Otalgia, right ear: Secondary | ICD-10-CM

## 2015-11-21 DIAGNOSIS — S0451XS Injury of facial nerve, right side, sequela: Secondary | ICD-10-CM

## 2015-11-21 DIAGNOSIS — G510 Bell's palsy: Secondary | ICD-10-CM | POA: Diagnosis not present

## 2015-11-21 NOTE — Progress Notes (Signed)
[facility]  PATIENT: Mark Arroyo DOB: 1970/09/19  REFERRING CLINICIAN: ER / hosp admission referral HISTORY FROM: patient REASON FOR VISIT: follow up    HISTORICAL  CHIEF COMPLAINT:  Chief Complaint  Patient presents with  . Injury R facial nerve, sequela    rm 6, Bell's Palsey, "saw ENT Tues, R eardrum ruptured, on antibiotics"  . Follow-up    HISTORY OF PRESENT ILLNESS:   UPDATE 11/21/15: Since last visit, doing much better with right facial weakness. 90% back to normal. 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   UPDATE 07/16/15: Since last visit, symptoms were improving, then started to worsen. More trouble with chewing, tongue, and eye closure issues. Pain in right ear is resolved since sinus surgery.  PRIOR HPI (05/28/15): 45 year old right-handed male here for evaluation of Bell's palsy. Labor Day 2016 patient noticed onset of strange sensation in the right ear and right jaw. Within a day or 2 he noticed significant asymmetry of his right face. On 05/12/15 patient went to the emergency room for evaluation. Initially code stroke was activated but then canceled. CT scan of the head was unremarkable. MRI of the brain showed no acute findings. He was diagnosed with right Bell's palsy and treated with valacyclovir and prednisone. Since discharge patient symptoms have gradually improved. He still has some mild weakness on the right side. He also has some fullness and pain sensation behind his right ear. No change in hearing or taste. Patient has history of cervical discectomy and fusion as well as extensive sinus surgeries in the past. Patient also has history of migraine headaches since age 45 years old. No prodromal viral infection symptoms, traumas or other factors before onset of right facial weakness.   REVIEW OF SYSTEMS: Full 14 system review of systems performed and notable only for headache  numbness.  ALLERGIES: No Known Allergies  HOME MEDICATIONS: Outpatient Prescriptions Prior to Visit  Medication Sig Dispense Refill  . albuterol (PROVENTIL HFA;VENTOLIN HFA) 108 (90 BASE) MCG/ACT inhaler Inhale 2 puffs into the lungs every 6 (six) hours as needed for wheezing or shortness of breath. 1 Inhaler 2  . DYMISTA 137-50 MCG/ACT SUSP 137-50 mcg/act    . ibuprofen (ADVIL,MOTRIN) 200 MG tablet Take 200 mg by mouth every 6 (six) hours as needed.    Marland Kitchen. artificial tears (LACRILUBE) OINT ophthalmic ointment Place into the right eye every 3 (three) hours as needed for dry eyes. 10 g 0   Facility-Administered Medications Prior to Visit  Medication Dose Route Frequency Provider Last Rate Last Dose  . gadopentetate dimeglumine (MAGNEVIST) injection 20 mL  20 mL Intravenous Once PRN Suanne MarkerVikram R Dayon Witt, MD        PAST MEDICAL HISTORY: Past Medical History  Diagnosis Date  . Asthma   . Seasonal allergic rhinitis   . Wears glasses   . GERD (gastroesophageal reflux disease)   . Migraines     as adult, age 45, usually stress related, no frequent headaches since 04/2014    PAST SURGICAL HISTORY: Past Surgical History  Procedure Laterality Date  . Anterior cervical decomp/discectomy fusion  2013    Dr. Yevette Edwardsumonski  . Medial collateral ligament repair, knee  2011    left  . Shoulder surgery Left 08/2013    left, labrum repair, RTC repair, spur removal; [place]  . Inguinal hernia repair  2003    right  . Nasal sinus surgery  06/25/15    x2,   .  Colonoscopy  38    due to stomach issues, normal, [place]  . Vasectomy  2013    FAMILY HISTORY: Family History  Problem Relation Age of Onset  . Diabetes Mother   . Heart disease Father 66    MI  . Kidney disease Sister   . Diabetes Brother   . Cancer Neg Hx     SOCIAL HISTORY:  Social History   Social History  . Marital Status: Married    Spouse Name: Gust Rung  . Number of Children: 4  . Years of Education: 14    Occupational History  .      [employer] de[employer]   Social History Main Topics  . Smoking status: Never Smoker   . Smokeless tobacco: Not on file  . Alcohol Use: No  . Drug Use: No  . Sexual Activity: Not on file   Other Topics Concern  . 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  Christian.  Hobbies - likes basketball, video games      Caffeine use- varies, soda and tea occ, not daily     PHYSICAL EXAM  GENERAL EXAM/CONSTITUTIONAL: Vitals:  Filed Vitals:   11/21/15 1122  BP: 111/67  Pulse: 64  Height:  (1.803 m)  Weight: 217 lb (98.431 kg)   Body mass index is 30.28 kg/(m^2). No exam data present  Patient is in no distress; well developed, nourished and groomed; neck is supple  TYMPANIC MEMBRANES NORMAL; EXT AUDITORY CANALS NORMAL  CARDIOVASCULAR:  Examination of carotid arteries is normal; no carotid bruits  Regular rate and rhythm, no murmurs  Examination of peripheral vascular system by observation and palpation is normal  EYES:  Ophthalmoscopic exam of optic discs and posterior segments is normal; no papilledema or hemorrhages  MUSCULOSKELETAL:  Gait, strength, tone, movements noted in Neurologic exam below  NEUROLOGIC: MENTAL STATUS:  No flowsheet data found.  awake, alert, oriented to person, place and time  recent and remote memory intact  normal attention and concentration  language fluent, comprehension intact, naming intact,   fund of knowledge appropriate  CRANIAL NERVE:   2nd - no papilledema on fundoscopic exam  2nd, 3rd, 4th, 6th - pupils equal and reactive to light, visual fields full to confrontation, extraocular muscles intact, no nystagmus  5th - facial sensation symm  7th - facial strength --> SUBLTE DECR RIGHT EYE BLINKING AND EY CLOSURE STRENGTH, BUT ALMOST NORMAL; SUBTLE DECR  RIGHT NL FOLD AND RIGHT LOWER FACIAL SMILE STRENGTH  8th - hearing intact  9th - palate elevates symmetrically, uvula midline  11th - shoulder shrug symmetric  12th - tongue protrusion midline  MOTOR:   normal bulk and tone, full strength in the BUE, BLE  SENSORY:   normal and symmetric to light touch, temperature, vibration   COORDINATION:   finger-nose-finger, fine finger movements normal  REFLEXES:   deep tendon reflexes present and symmetric  GAIT/STATION:   narrow based gait    DIAGNOSTIC DATA (LABS, IMAGING, TESTING) - I reviewed patient records, labs, notes, testing and imaging myself where available.  Lab Results  Component Value Date   WBC 5.8 05/12/2015   HGB 16.0 05/12/2015   HCT 47.0 05/12/2015   MCV 81.9 05/12/2015   PLT 199 05/12/2015      Component Value Date/Time   NA 140 05/12/2015 1635  K 3.9 05/12/2015 1635   CL 102 05/12/2015 1635   CO2 28 05/12/2015 1626   GLUCOSE 112* 05/12/2015 1635   BUN 10 05/12/2015 1635   CREATININE 1.10 05/12/2015 1635   CREATININE 0.92 01/15/2015 1051   CALCIUM 9.4 05/12/2015 1626   PROT 7.1 05/12/2015 1626   ALBUMIN 3.8 05/12/2015 1626   AST 26 05/12/2015 1626   ALT 15* 05/12/2015 1626   ALKPHOS 58 05/12/2015 1626   BILITOT 1.1 05/12/2015 1626   GFRNONAA >60 05/12/2015 1626   GFRAA >60 05/12/2015 1626   Lab Results  Component Value Date   CHOL 198 01/15/2015   HDL 49 01/15/2015   LDLCALC 130* 01/15/2015   TRIG 97 01/15/2015   CHOLHDL 4.0 01/15/2015   Lab Results  Component Value Date   HGBA1C 6.1* 01/15/2015   No results found for: [id number] Lab Results  Component Value Date   TSH 1.804 01/15/2015    05/12/15 CT head  1.No evidence for acute intracranial abnormality. 2. Sinus disease. Probable chronic fractures of the left orbit and sinuses. Critical Value/emergent results were called by telephone at the time of interpretation on 05/12/2015 at 4:43 pm to Dr. Hosie Poisson, who verbally  acknowledged these results.  05/12/15 MRI brain [I reviewed images myself and agree with interpretation. -VRP]  - Negative for acute infarct. - Solitary hyperintensity left sub insular white matter. Differential includes chronic ischemia and demyelinating disease. - Extensive chronic sinusitis.  08/13/15 Equivocal MRI brain and IAC (with and without) [I reviewed images myself and agree with interpretation. -VRP]  1. Stable, subtle left sub-insular focus of non-specific gliosis. 2. No intrinsic, compressive or inflammatory right facial nerve lesions. 3. No change from MRI on 05/12/15.     ASSESSMENT AND PLAN  45 y.o. year old male here with right peripheral facial nerve weakness starting beginning of September 2016, most consistent with Bell's palsy. Patient has had some recovery initially, but now symptoms then worsened, prompting repeat MRI brain and IAC, which was unchanged. Now symptoms are 90% back to normal.    Dx:  Right-sided Bell's palsy  Injury of right facial nerve, sequela  Right ear pain    PLAN: - monitor symptoms  Return if symptoms worsen or fail to improve, for return to PCP.    Suanne Marker, MD 11/21/2015, 11:41 AM Certified in Neurology, Neurophysiology and Neuroimaging  [facility] [address] [place], [place] [address] [phone]

## 2015-11-21 NOTE — Patient Instructions (Signed)
-   monitor symptoms

## 2016-02-09 ENCOUNTER — Emergency Department (HOSPITAL_BASED_OUTPATIENT_CLINIC_OR_DEPARTMENT_OTHER): Payer: Managed Care, Other (non HMO)

## 2016-02-09 ENCOUNTER — Encounter (HOSPITAL_BASED_OUTPATIENT_CLINIC_OR_DEPARTMENT_OTHER): Payer: Self-pay | Admitting: *Deleted

## 2016-02-09 ENCOUNTER — Emergency Department (HOSPITAL_BASED_OUTPATIENT_CLINIC_OR_DEPARTMENT_OTHER)
Admission: EM | Admit: 2016-02-09 | Discharge: 2016-02-09 | Disposition: A | Discharge status: Home / Self Care | Payer: Managed Care, Other (non HMO) | Attending: Emergency Medicine | Admitting: Emergency Medicine

## 2016-02-09 DIAGNOSIS — R200 Anesthesia of skin: Secondary | ICD-10-CM | POA: Diagnosis not present

## 2016-02-09 DIAGNOSIS — R51 Headache: Secondary | ICD-10-CM | POA: Diagnosis present

## 2016-02-09 DIAGNOSIS — J0191 Acute recurrent sinusitis, unspecified: Secondary | ICD-10-CM | POA: Diagnosis not present

## 2016-02-09 DIAGNOSIS — J45909 Unspecified asthma, uncomplicated: Secondary | ICD-10-CM | POA: Insufficient documentation

## 2016-02-09 DIAGNOSIS — R0602 Shortness of breath: Secondary | ICD-10-CM | POA: Diagnosis not present

## 2016-02-09 DIAGNOSIS — M549 Dorsalgia, unspecified: Secondary | ICD-10-CM | POA: Diagnosis not present

## 2016-02-09 DIAGNOSIS — R519 Headache, unspecified: Secondary | ICD-10-CM

## 2016-02-09 DIAGNOSIS — B349 Viral infection, unspecified: Secondary | ICD-10-CM

## 2016-02-09 LAB — URINALYSIS, ROUTINE W REFLEX MICROSCOPIC
Bilirubin Urine: NEGATIVE
Glucose, UA: NEGATIVE mg/dL
Hgb urine dipstick: NEGATIVE
KETONES UR: NEGATIVE mg/dL
LEUKOCYTES UA: NEGATIVE
NITRITE: NEGATIVE
PH: 5.5 (ref 5.0–8.0)
PROTEIN: NEGATIVE mg/dL
Specific Gravity, Urine: 1.022 (ref 1.005–1.030)

## 2016-02-09 LAB — CBC WITH DIFFERENTIAL/PLATELET
BASOS ABS: 0 10*3/uL (ref 0.0–0.1)
Basophils Relative: 0 %
EOS ABS: 0 10*3/uL (ref 0.0–0.7)
Eosinophils Relative: 0 %
HEMATOCRIT: 42.1 % (ref 39.0–52.0)
HEMOGLOBIN: 13.9 g/dL (ref 13.0–17.0)
LYMPHS PCT: 5 %
Lymphs Abs: 0.7 10*3/uL (ref 0.7–4.0)
MCH: 26.6 pg (ref 26.0–34.0)
MCHC: 33 g/dL (ref 30.0–36.0)
MCV: 80.7 fL (ref 78.0–100.0)
Monocytes Absolute: 0.6 10*3/uL (ref 0.1–1.0)
Monocytes Relative: 4 %
NEUTROS PCT: 91 %
Neutro Abs: 13.1 10*3/uL — ABNORMAL HIGH (ref 1.7–7.7)
Platelets: 200 10*3/uL (ref 150–400)
RBC: 5.22 MIL/uL (ref 4.22–5.81)
RDW: 15 % (ref 11.5–15.5)
WBC: 14.4 10*3/uL — ABNORMAL HIGH (ref 4.0–10.5)

## 2016-02-09 LAB — COMPREHENSIVE METABOLIC PANEL
ALK PHOS: 67 U/L (ref 38–126)
ALT: 17 U/L (ref 17–63)
ANION GAP: 10 (ref 5–15)
AST: 21 U/L (ref 15–41)
Albumin: 3.8 g/dL (ref 3.5–5.0)
BUN: 11 mg/dL (ref 6–20)
CALCIUM: 9.4 mg/dL (ref 8.9–10.3)
CO2: 22 mmol/L (ref 22–32)
Chloride: 104 mmol/L (ref 101–111)
Creatinine, Ser: 1.04 mg/dL (ref 0.61–1.24)
GFR calc non Af Amer: >60 mL/min (ref >60)
Glucose, Bld: 122 mg/dL — ABNORMAL HIGH (ref 65–99)
POTASSIUM: 3.6 mmol/L (ref 3.5–5.1)
SODIUM: 136 mmol/L (ref 135–145)
TOTAL PROTEIN: 8 g/dL (ref 6.5–8.1)
Total Bilirubin: 1 mg/dL (ref 0.3–1.2)

## 2016-02-09 MED ORDER — AMOXICILLIN-POT CLAVULANATE 875-125 MG PO TABS: 1.0000 | ORAL_TABLET | Freq: Two times a day (BID) | ORAL | Status: DC

## 2016-02-09 MED ORDER — SODIUM CHLORIDE 0.9 % IV BOLUS (SEPSIS)
1000.0000 mL | Freq: Once | INTRAVENOUS | Status: AC
Start: 2016-02-09 — End: 2016-02-09
  Administered 2016-02-09: 1000 mL via INTRAVENOUS

## 2016-02-09 MED ORDER — ACETAMINOPHEN 500 MG PO TABS
1000.0000 mg | ORAL_TABLET | Freq: Once | ORAL | Status: AC
  Administered 2016-02-09: 1000 mg via ORAL
  Filled 2016-02-09: qty 2

## 2016-02-09 MED ORDER — KETOROLAC TROMETHAMINE 30 MG/ML IJ SOLN
30.0000 mg | Freq: Once | INTRAMUSCULAR | Status: AC
  Administered 2016-02-09: 30 mg via INTRAVENOUS
  Filled 2016-02-09: qty 1

## 2016-02-09 NOTE — ED Provider Notes (Signed)
CSN: [id number]     Arrival date & time 02/09/16  1914 History  By signing my name below, I, Bethel BornBritney McCollum, attest that this documentation has been prepared under the direction and in the presence of Marily MemosJason Lashuna Tamashiro, MD. Electronically Signed: Bethel BornBritney McCollum, ED Scribe. 02/09/2016. 8:07 PM   Chief Complaint  Patient presents with  . Headache   The history is provided by the patient. No language interpreter was used.   Mark Arroyo is a 45 y.o. male with PMHx of migraines and asthma who presents to the Emergency Department complaining of constant, waxing and waning, frontal  headache with onset earlier today. Associated symptoms include chills, intermittent epistaxis since yesterday, numbness and tingling at the right hand, bilateral knee pain, and back pain. He had SOB this morning but it resolved after he took his Advair. Pt denies cough, abdominal pain, increased frequency, ear pain, and neck pain. He has no known history no heart disease. Pt denies known sick contact.   Past Medical History  Diagnosis Date  . Asthma   . Seasonal allergic rhinitis   . Wears glasses   . GERD (gastroesophageal reflux disease)   . Migraines     as adult, age 45, usually stress related, no frequent headaches since 04/2014   Past Surgical History  Procedure Laterality Date  . Anterior cervical decomp/discectomy fusion  2013    Dr. Yevette Edwardsumonski  . Medial collateral ligament repair, knee  2011    left  . Shoulder surgery Left 08/2013    left, labrum repair, RTC repair, spur removal; [place]  . Inguinal hernia repair  2003    right  . Nasal sinus surgery  06/25/15    x2,   . Colonoscopy  38    due to stomach issues, normal, [place]  . Vasectomy  2013   Family History  Problem Relation Age of Onset  . Diabetes Mother   . Heart disease Father 2650    MI  . Kidney disease Sister   . Diabetes Brother   . Cancer Neg Hx    Social History  Substance Use Topics  . Smoking status: Never Smoker    . Smokeless tobacco: None  . Alcohol Use: No    Review of Systems  Constitutional: Positive for chills.  HENT: Negative for ear pain.   Respiratory: Positive for shortness of breath. Negative for cough.   Gastrointestinal: Negative for abdominal pain.  Genitourinary: Negative for dysuria and frequency.  Musculoskeletal: Positive for back pain and arthralgias. Negative for neck pain.  Skin: Negative for rash.  Neurological: Positive for numbness and headaches.  All other systems reviewed and are negative.   Allergies  Review of patient's allergies indicates no known allergies.  Home Medications   Prior to Admission medications   Medication Sig Start Date End Date Taking? Authorizing Provider  albuterol (PROVENTIL HFA;VENTOLIN HFA) 108 (90 BASE) MCG/ACT inhaler Inhale 2 puffs into the lungs every 6 (six) hours as needed for wheezing or shortness of breath. 07/23/15   Kermit Baloavid S Tysinger, PA-C  amoxicillin-clavulanate (AUGMENTIN) 875-125 MG tablet Take 1 tablet by mouth 2 (two) times daily. One po bid x 7 days 02/09/16   Marily MemosJason Carrieann Spielberg, MD  budesonide (PULMICORT) 0.5 MG/2ML nebulizer solution  09/06/15   Historical Provider, MD  DYMISTA 137-50 MCG/ACT SUSP 137-50 mcg/act 07/14/15   Historical Provider, MD  fluticasone (FLONASE) 50 MCG/ACT nasal spray Place into the nose.    Historical Provider, MD  ibuprofen (ADVIL,MOTRIN) 200 MG tablet Take 200  mg by mouth every 6 (six) hours as needed.    Historical Provider, MD   BP 107/67 mmHg  Pulse 105  Temp(Src) 97.4 F (36.3 C) (Oral)  Resp 18  Ht  (1.803 m)  Wt 211 lb (95.709 kg)  BMI 29.44 kg/m2  SpO2 97% Physical Exam  HENT:  ttp to left frontal  Neck:  No nuchal rigidity   Cardiovascular: Regular rhythm.   Slightly tachycardic   Pulmonary/Chest: Effort normal and breath sounds normal. No respiratory distress.  CTAB   Musculoskeletal:  Mild TTP at right trapezius  Tenderness, myalgias, and arthralgias   Skin: No rash noted.   Slightly diaphoretic   Nursing note and vitals reviewed.   ED Course  Procedures (including critical care time) DIAGNOSTIC STUDIES: Oxygen Saturation is 97% on RA,  normal by my interpretation.    COORDINATION OF CARE: 8:04 PM Discussed treatment plan which includes CXR, labs, and IVF with pt at bedside and pt agreed to plan.  Labs Review Labs Reviewed  COMPREHENSIVE METABOLIC PANEL - Abnormal; Notable for the following:    Glucose, Bld 122 (*)    All other components within normal limits  CBC WITH DIFFERENTIAL/PLATELET - Abnormal; Notable for the following:    WBC 14.4 (*)    Neutro Abs 13.1 (*)    All other components within normal limits  URINALYSIS, ROUTINE W REFLEX MICROSCOPIC (NOT AT [facility])    Imaging Review Dg Chest 2 View  02/09/2016  CLINICAL DATA:  Shortness of breath EXAM: CHEST  2 VIEW COMPARISON:  February 09, 2011 FINDINGS: The heart size and mediastinal contours are within normal limits. Both lungs are clear. The visualized skeletal structures are unremarkable. IMPRESSION: No active cardiopulmonary disease. Electronically Signed   By: Gerome Sam III M.D   On: 02/09/2016 21:00   I have personally reviewed and evaluated these images and lab results as part of my medical decision-making.   EKG Interpretation   Date/Time:  Monday February 09 2016 20:13:49 EDT Ventricular Rate:  96 PR Interval:  152 QRS Duration: 94 QT Interval:  345 QTC Calculation: 436 R Axis:   71 Text Interpretation:  Sinus rhythm RSR' in V1 or V2, probably normal  variant ST elev, probable normal early repol pattern Baseline wander in  lead(s) II III aVF Confirmed by [facility] MD, Barbara Cower [phone]) on 02/09/2016  8:23:06 PM      MDM   Final diagnoses:  Acute recurrent sinusitis, unspecified location  Nonintractable headache, unspecified chronicity pattern, unspecified headache type  Viral syndrome    45 year old male here with likely acute sinusitis. Patient with left-sided frontal headache  similar previous episodes of sinusitis. Had a rectal temperature 100.3 after antipyretics is improved and so are his symptoms. Started on Augmentin and follow-up with his primary doctor for recurrent sinusitis. These are improving 2-3 days and return here for further workup. No nuchal rigidity, neurologic changes or other evidence of meningitis.  New Prescriptions: Discharge Medication List as of 02/09/2016 10:46 PM    START taking these medications   Details  amoxicillin-clavulanate (AUGMENTIN) 875-125 MG tablet Take 1 tablet by mouth 2 (two) times daily. One po bid x 7 days, Starting 02/09/2016, Until Discontinued, Print        I have personally and contemperaneously reviewed labs and imaging and used in my decision making as above.   A medical screening exam was performed and I feel the patient has had an appropriate workup for their chief complaint at this time and  likelihood of emergent condition existing is low and thus workup can continue on an outpatient basis.. Their vital signs are stable. They have been counseled on decision, discharge, follow up and which symptoms necessitate immediate return to the emergency department.  They verbally stated understanding and agreement with plan and discharged in stable condition.   I personally performed the services described in this documentation, which was scribed in my presence. The recorded information has been reviewed and is accurate.    Marily Memos, MD 02/10/16 [phone]

## 2016-02-09 NOTE — ED Notes (Signed)
Headache, body aches, numbness in both of his hands and he feels SOB. He is speaking in complete sentences and does not appear to be in respiratory distress. Lungs are clear.

## 2016-02-23 ENCOUNTER — Ambulatory Visit (INDEPENDENT_AMBULATORY_CARE_PROVIDER_SITE_OTHER): Payer: Managed Care, Other (non HMO) | Admitting: Medical

## 2016-02-23 ENCOUNTER — Encounter: Payer: Self-pay | Admitting: Medical

## 2016-02-23 VITALS — BP 110/78 | HR 70 | Ht 69.5 in | Wt 212.0 lb

## 2016-02-23 DIAGNOSIS — E669 Obesity, unspecified: Secondary | ICD-10-CM

## 2016-02-23 DIAGNOSIS — R7301 Impaired fasting glucose: Secondary | ICD-10-CM

## 2016-02-23 DIAGNOSIS — Z0000 Encounter for general adult medical examination without abnormal findings: Secondary | ICD-10-CM | POA: Diagnosis not present

## 2016-02-23 DIAGNOSIS — J309 Allergic rhinitis, unspecified: Secondary | ICD-10-CM | POA: Diagnosis not present

## 2016-02-23 DIAGNOSIS — R109 Unspecified abdominal pain: Secondary | ICD-10-CM

## 2016-02-23 DIAGNOSIS — K589 Irritable bowel syndrome without diarrhea: Secondary | ICD-10-CM | POA: Insufficient documentation

## 2016-02-23 DIAGNOSIS — J4530 Mild persistent asthma, uncomplicated: Secondary | ICD-10-CM

## 2016-02-23 LAB — LIPID PANEL
CHOLESTEROL: 194 mg/dL (ref 125–200)
HDL: 54 mg/dL (ref >=40)
LDL CALC: 122 mg/dL (ref <130)
TRIGLYCERIDES: 91 mg/dL (ref <150)
Total CHOL/HDL Ratio: 3.6 Ratio (ref <=5.0)
VLDL: 18 mg/dL (ref <30)

## 2016-02-23 LAB — HEMOGLOBIN A1C
Hgb A1c MFr Bld: 5.8 % — ABNORMAL HIGH (ref <5.7)
MEAN PLASMA GLUCOSE: 120 mg/dL

## 2016-02-23 LAB — GLUCOSE, RANDOM: GLUCOSE: 96 mg/dL (ref 65–99)

## 2016-02-23 NOTE — Progress Notes (Signed)
Subjective:   HPI  Mark Arroyo is a 45 y.o. male who presents for a complete physical.   Medical team:  [facility]  Sees eye doctor and dentis  Dr. Melvenia Beam, ENT TYSINGER, [facility], PA-C here for primary care  Concerns: He notes few weeks ago , had tingling in extremities, didn't feel well, went to urgent care in [facility] (records in [place] EMR reviewed), was given antibiotics, and a few days later felt fine.  Asthma - no recent issues.   Sees asthma doctor and does daily preventative inhaler.   Has GI issues.   In the mornings ends up having 2-3 BMs after he gets up, feeling of incomplete evacuation.  Wife concerned about his BMs.  Wonders if its diet.   After eating hamburger, has to go to the bathroom soon afterwards.  Gets cramps at times.   Prior doctor diagnosed him with IBS.  Using nothing in particular for this.  Reviewed their medical, surgical, family, social, medication, and allergy history and updated chart as appropriate.  Past Medical History  Diagnosis Date  . Asthma   . Seasonal allergic rhinitis   . Wears glasses   . GERD (gastroesophageal reflux disease)   . Migraines     as adult, age 89, usually stress related, no frequent headaches since 04/2014    Past Surgical History  Procedure Laterality Date  . Anterior cervical decomp/discectomy fusion  2013    Dr. Yevette Edwards  . Medial collateral ligament repair, knee  2011    left  . Shoulder surgery Left 08/2013    left, labrum repair, RTC repair, spur removal; [place]  . Inguinal hernia repair  2003    right  . Nasal sinus surgery  06/25/15    x2,   . Colonoscopy  38    due to stomach issues, normal, [place]  . Vasectomy  2013    Social History   Social History  . Marital Status: Married    Spouse Name: Gust Rung  . Number of Children: 4  . Years of Education: 14   Occupational History  .      [employer] dept   Social History Main Topics  .  Smoking status: Never Smoker   . Smokeless tobacco: Not on file  . Alcohol Use: Yes     Comment: rarely  . Drug Use: No  . Sexual Activity: Not on file   Other Topics Concern  . 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  Christian.  Hobbies - likes basketball, video games.  Caffeine use- varies, soda and tea occ, not daily.  As of 02/2016.    Family History  Problem Relation Age of Onset  . Diabetes Mother   . Heart disease Father 32    MI  . Prostatitis Father   . Kidney disease Sister   . Diabetes Brother   . Cancer Neg Hx      Current outpatient prescriptions:  .  budesonide (PULMICORT) 0.5 MG/2ML nebulizer solution, Reported on 02/23/2016, Disp: , Rfl:  .  fluticasone (FLONASE) 50 MCG/ACT nasal spray, Place into the nose., Disp: , Rfl:  .  albuterol (PROVENTIL HFA;VENTOLIN HFA) 108 (90 BASE) MCG/ACT inhaler, Inhale 2 puffs into the lungs every 6 (six) hours as needed for wheezing or shortness of breath. (Patient not taking: Reported  on 02/23/2016), Disp: 1 Inhaler, Rfl: 2 .  DYMISTA 137-50 MCG/ACT SUSP, Reported on 02/23/2016, Disp: , Rfl:  .  ibuprofen (ADVIL,MOTRIN) 200 MG tablet, Take 200 mg by mouth every 6 (six) hours as needed. Reported on 02/23/2016, Disp: , Rfl:  No current facility-administered medications for this visit.  Facility-Administered Medications Ordered in Other Visits:  .  gadopentetate dimeglumine (MAGNEVIST) injection 20 mL, 20 mL, Intravenous, Once PRN, Suanne Marker, MD  No Known Allergies   Review of Systems Constitutional: -fever, -chills, -sweats, -unexpected weight change, -decreased appetite, -fatigue Allergy: -sneezing, -itching, -congestion Dermatology: -changing moles, --rash, -lumps ENT: -runny nose, -ear pain, -sore throat, -hoarseness, -sinus pain, -teeth pain, - ringing in ears, -hearing loss,  -nosebleeds Cardiology: -chest pain, -palpitations, -swelling, -difficulty breathing when lying flat, -waking up short of breath Respiratory: -cough, -shortness of breath, -difficulty breathing with exercise or exertion, -wheezing, -coughing up blood Gastroenterology: +abdominal pain, -nausea, -vomiting, +diarrhea, -constipation, -blood in stool, +changes in bowel movement, -difficulty swallowing or eating Hematology: -bleeding, -bruising  Musculoskeletal: -joint aches, -muscle aches, -joint swelling, -back pain, -neck pain, -cramping, -changes in gait Ophthalmology: denies vision changes, eye redness, itching, discharge Urology: -burning with urination, -difficulty urinating, -blood in urine, -urinary frequency, -urgency, -incontinence Neurology: -headache, -weakness, -tingling, -numbness, -memory loss, -falls, -dizziness Psychology: -depressed mood, -agitation, -sleep problems     Objective:   Physical Exam  BP 110/78 mmHg  Pulse 70  Ht 5' 9.5" (1.765 m)  Wt 212 lb (96.163 kg)  BMI 30.87 kg/m2  General appearance: alert, no distress, WD/WN, AA male Skin: 2 small flat brown birth marks <4cm diameter in left and low back, few scattered macules, no worrisome lesions, right upper arm laterally with dagger tattoo HEENT: normocephalic, conjunctiva/corneas normal, sclerae anicteric, PERRLA, EOMi, nares patent, no discharge or erythema, pharynx normal Oral cavity: MMM, tongue normal, teeth normal Neck: faint anterior neck surgical scar, supple, no lymphadenopathy, no thyromegaly, no masses, normal ROM, no bruits Chest: non tender, normal shape and expansion Heart: RRR, normal S1, S2, no murmurs Lungs: CTA bilaterally, no wheezes, rhonchi, or rales Abdomen: +bs, soft, non tender, non distended, no masses, no hepatomegaly, no splenomegaly, no bruits Back: non tender, normal ROM, no scoliosis Musculoskeletal: port surgical scars of left shoulder and left knee, upper extremities non tender, no  obvious deformity, normal ROM throughout, lower extremities non tender, no obvious deformity, normal ROM throughout Extremities: no edema, no cyanosis, no clubbing Pulses: 2+ symmetric, upper and lower extremities, normal cap refill Neurological: alert, oriented x 3, CN2-12 intact, strength normal upper extremities and lower extremities, sensation normal throughout, DTRs 2+ throughout, no cerebellar signs, gait normal Psychiatric: normal affect, behavior normal, pleasant  GU: normal male external genitalia, circumcised, nontender, left inguinal hernia surgical scar, no masses, no hernia, no lymphadenopathy Rectal: deferred   Assessment and Plan :    Encounter Diagnoses  Name Primary?  . Encounter for health maintenance examination in adult Yes  . Mild persistent asthma, uncomplicated   . Allergic rhinitis, unspecified allergic rhinitis type   . Abdominal cramping   . IBS (irritable bowel syndrome)   . Obesity   . Impaired fasting blood sugar     Physical exam - discussed healthy lifestyle, diet, exercise, preventative care, vaccinations, and addressed their concerns.   Routine screening labs today See your eye doctor yearly for routine vision care. See your dentist yearly for routine dental care including hygiene visits twice yearly.  Asthma - mild intermittent, controlled on current medication, sees asthma  specialist.  Discussed prostate and testicular cancer screening, risks/benefits IBS - begin food diary, trial of probiotic, samples of IB Gard given.  Consider anti-spasmodic. Obesity - work on weight loss through healthy diet and exercise.  Vaccines: Advised yearly flu vaccine  Follow-up pending labs

## 2016-02-24 LAB — CELIAC DISEASE COMPREHENSIVE PANEL WITH REFLEXES
IGA: 370 mg/dL (ref 81–463)
Tissue Transglutaminase Ab, IgA: 1 U/mL (ref <4)

## 2016-07-12 ENCOUNTER — Other Ambulatory Visit: Payer: Self-pay | Admitting: Medical

## 2016-09-12 ENCOUNTER — Other Ambulatory Visit: Payer: Self-pay | Admitting: Medical

## 2016-11-12 ENCOUNTER — Other Ambulatory Visit: Payer: Self-pay | Admitting: Medical

## 2016-11-12 NOTE — Telephone Encounter (Signed)
Is this okay to refill? 

## 2017-01-12 ENCOUNTER — Other Ambulatory Visit: Payer: Self-pay | Admitting: Medical

## 2017-02-23 ENCOUNTER — Ambulatory Visit (INDEPENDENT_AMBULATORY_CARE_PROVIDER_SITE_OTHER): Payer: 59 | Admitting: Medical

## 2017-02-23 ENCOUNTER — Encounter: Payer: Self-pay | Admitting: Medical

## 2017-02-23 VITALS — BP 120/80 | HR 71 | Ht 70.0 in | Wt 216.4 lb

## 2017-02-23 DIAGNOSIS — Z125 Encounter for screening for malignant neoplasm of prostate: Secondary | ICD-10-CM

## 2017-02-23 DIAGNOSIS — K589 Irritable bowel syndrome without diarrhea: Secondary | ICD-10-CM | POA: Diagnosis not present

## 2017-02-23 DIAGNOSIS — E669 Obesity, unspecified: Secondary | ICD-10-CM

## 2017-02-23 DIAGNOSIS — J4520 Mild intermittent asthma, uncomplicated: Secondary | ICD-10-CM | POA: Diagnosis not present

## 2017-02-23 DIAGNOSIS — J301 Allergic rhinitis due to pollen: Secondary | ICD-10-CM | POA: Diagnosis not present

## 2017-02-23 DIAGNOSIS — Z0000 Encounter for general adult medical examination without abnormal findings: Secondary | ICD-10-CM

## 2017-02-23 DIAGNOSIS — Z8249 Family history of ischemic heart disease and other diseases of the circulatory system: Secondary | ICD-10-CM | POA: Insufficient documentation

## 2017-02-23 DIAGNOSIS — R7301 Impaired fasting glucose: Secondary | ICD-10-CM

## 2017-02-23 DIAGNOSIS — Z6831 Body mass index (BMI) 31.0-31.9, adult: Secondary | ICD-10-CM

## 2017-02-23 LAB — COMPREHENSIVE METABOLIC PANEL
ALBUMIN: 4 g/dL (ref 3.6–5.1)
ALK PHOS: 60 U/L (ref 40–115)
ALT: 13 U/L (ref 9–46)
AST: 13 U/L (ref 10–40)
BUN: 8 mg/dL (ref 7–25)
CALCIUM: 8.9 mg/dL (ref 8.6–10.3)
CHLORIDE: 104 mmol/L (ref 98–110)
CO2: 25 mmol/L (ref 20–31)
Creat: 1.07 mg/dL (ref 0.60–1.35)
Glucose, Bld: 101 mg/dL — ABNORMAL HIGH (ref 65–99)
POTASSIUM: 3.9 mmol/L (ref 3.5–5.3)
Sodium: 139 mmol/L (ref 135–146)
TOTAL PROTEIN: 6.9 g/dL (ref 6.1–8.1)
Total Bilirubin: 0.6 mg/dL (ref 0.2–1.2)

## 2017-02-23 LAB — LIPID PANEL
CHOL/HDL RATIO: 3.9 ratio (ref <5.0)
CHOLESTEROL: 165 mg/dL (ref <200)
HDL: 42 mg/dL (ref >40)
LDL Cholesterol: 100 mg/dL — ABNORMAL HIGH (ref <100)
Triglycerides: 116 mg/dL (ref <150)
VLDL: 23 mg/dL (ref <30)

## 2017-02-23 LAB — POCT URINALYSIS DIP (PROADVANTAGE DEVICE)
BILIRUBIN UA: NEGATIVE mg/dL
Bilirubin, UA: NEGATIVE
Blood, UA: NEGATIVE
Glucose, UA: NEGATIVE mg/dL
Leukocytes, UA: NEGATIVE
Nitrite, UA: NEGATIVE
PH UA: 6 (ref 5.0–8.0)
Protein Ur, POC: NEGATIVE mg/dL
SPECIFIC GRAVITY, URINE: 1.03
Urobilinogen, Ur: NEGATIVE

## 2017-02-23 LAB — CBC
HCT: 42.8 % (ref 38.5–50.0)
HEMOGLOBIN: 13.8 g/dL (ref 13.2–17.1)
MCH: 26.5 pg — AB (ref 27.0–33.0)
MCHC: 32.2 g/dL (ref 32.0–36.0)
MCV: 82.1 fL (ref 80.0–100.0)
MPV: 11.1 fL (ref 7.5–12.5)
PLATELETS: 211 10*3/uL (ref 140–400)
RBC: 5.21 MIL/uL (ref 4.20–5.80)
RDW: 14.2 % (ref 11.0–15.0)
WBC: 4.5 10*3/uL (ref 4.0–10.5)

## 2017-02-23 MED ORDER — ALBUTEROL SULFATE HFA 108 (90 BASE) MCG/ACT IN AERS: 2.0000 | INHALATION_SPRAY | Freq: Four times a day (QID) | RESPIRATORY_TRACT | 1 refills | Status: DC | PRN

## 2017-02-23 NOTE — Progress Notes (Signed)
Subjective:   HPI  Mark Arroyo is a 46 y.o. male who presents for a complete physical.   Medical team:  [facility]  Sees eye doctor and dentis  Dr. Melvenia Beam, ENT Andrianna Manalang, Kermit Balo, PA-C here for primary care   Concerns: Been doing well, no issues.  Asthma - no recent issues.   Sees asthma doctor and does daily preventative inhaler.   Hx/o IBS, but no frequent issues.   Reviewed their medical, surgical, family, social, medication, and allergy history and updated chart as appropriate.  Past Medical History:  Diagnosis Date  . Asthma   . GERD (gastroesophageal reflux disease)    intermittent  . Migraines    as adult, age 39, usually stress related, no frequent headaches since 04/2014  . Seasonal allergic rhinitis   . Wears glasses     Past Surgical History:  Procedure Laterality Date  . ANTERIOR CERVICAL DECOMP/DISCECTOMY FUSION  2013   Dr. Yevette Edwards  . COLONOSCOPY  38   due to stomach issues, normal, [place]  . INGUINAL HERNIA REPAIR  2003   right  . MEDIAL COLLATERAL LIGAMENT REPAIR, KNEE  2011   left  . NASAL SINUS SURGERY  06/25/15   x2,   . SHOULDER SURGERY Left 08/2013   left, labrum repair, RTC repair, spur removal; [place]  . VASECTOMY  2013    Social History   Social History  . Marital status: Married    Spouse name: Gust Rung  . Number of children: 4  . Years of education: 14   Occupational History  .      [employer] de[employer]   Social History Main Topics  . Smoking status: Never Smoker  . Smokeless tobacco: Never Used  . Alcohol use No  . Drug use: No  . Sexual activity: Not on file   Other Topics Concern  . 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  Christian.  Hobbies - likes basketball, video games.  Caffeine use- varies, soda and tea occ, not daily.  As of 02/2017     Family History  Problem Relation Age of Onset  . Diabetes Mother   . Heart disease Father 81       MI  . Prostatitis Father   . Kidney disease Sister   . Diabetes Brother   . Cancer Neg Hx      Current Outpatient Prescriptions:  .  albuterol (PROAIR HFA) 108 (90 Base) MCG/ACT inhaler, Inhale 2 puffs into the lungs every 6 (six) hours as needed for wheezing or shortness of breath., Disp: 18 g, Rfl: 1 .  ibuprofen (ADVIL,MOTRIN) 200 MG tablet, Take 200 mg by mouth every 6 (six) hours as needed. Reported on 02/23/2016, Disp: , Rfl:  No current facility-administered medications for this visit.   Facility-Administered Medications Ordered in Other Visits:  .  gadopentetate dimeglumine (MAGNEVIST) injection 20 mL, 20 mL, Intravenous, Once PRN, Penumalli, Vikram R, MD  No Known Allergies   Review of Systems Constitutional: -fever, -chills, -sweats, -unexpected weight change, -decreased appetite, -fatigue Allergy: -sneezing, -itching, -congestion Dermatology: -changing moles, --rash, -lumps ENT: -runny nose, -ear pain, -sore throat, -hoarseness, -sinus pain, -teeth pain, - ringing in ears, -hearing loss, -nosebleeds Cardiology: -chest pain, -palpitations, -swelling, -difficulty breathing when lying flat, -waking up short of breath Respiratory: -cough, -shortness of breath, -difficulty breathing with  exercise or exertion, -wheezing, -coughing up blood Gastroenterology: -abdominal pain, -nausea, -vomiting, +diarrhea, -constipation, -blood in stool, +changes in bowel movement, -difficulty swallowing or eating Hematology: -bleeding, -bruising  Musculoskeletal: -joint aches, -muscle aches, -joint swelling, -back pain, -neck pain, -cramping, -changes in gait Ophthalmology: denies vision changes, eye redness, itching, discharge Urology: -burning with urination, -difficulty urinating, -blood in urine, -urinary frequency, -urgency, -incontinence Neurology: -headache, -weakness, -tingling,  -numbness, -memory loss, -falls, -dizziness Psychology: -depressed mood, -agitation, -sleep problems     Objective:   Physical Exam  BP 120/80   Pulse 71   Ht 5\' 10"  (1.778 m)   Wt 216 lb 6.4 oz (98.2 kg)   SpO2 97%   BMI 31.05 kg/m   General appearance: alert, no distress, WD/WN, AA male Skin: 2 small flat brown birth marks <4cm diameter in left and low back, few scattered macules, no worrisome lesions, right upper arm laterally with dagger tattoo HEENT: normocephalic, conjunctiva/corneas normal, sclerae anicteric, PERRLA, EOMi, nares patent, no discharge or erythema, pharynx normal Oral cavity: MMM, tongue normal, teeth in good repair Neck: faint anterior neck surgical scar, supple, no lymphadenopathy, no thyromegaly, no masses, normal ROM, no bruits Chest: non tender, normal shape and expansion Heart: RRR, normal S1, S2, no murmurs Lungs: CTA bilaterally, no wheezes, rhonchi, or rales Abdomen: +bs, soft, non tender, non distended, no masses, no hepatomegaly, no splenomegaly, no bruits Back: non tender, normal ROM, no scoliosis Musculoskeletal: port surgical scars of left shoulder and left knee, upper extremities non tender, no obvious deformity, normal ROM throughout, lower extremities non tender, no obvious deformity, normal ROM throughout Extremities: no edema, no cyanosis, no clubbing Pulses: 2+ symmetric, upper and lower extremities, normal cap refill Neurological: alert, oriented x 3, CN2-12 intact, strength normal upper extremities and lower extremities, sensation normal throughout, DTRs 2+ throughout, no cerebellar signs, gait normal Psychiatric: normal affect, behavior normal, pleasant  GU: normal male external genitalia, circumcised, nontender, left inguinal hernia surgical scar, no masses, no hernia, no lymphadenopathy Rectal: deferred   Assessment and Plan :    Encounter Diagnoses  Name Primary?  . Encounter for health maintenance examination in adult Yes  . Mild  intermittent asthma without complication   . Allergic rhinitis due to pollen, unspecified seasonality   . Irritable bowel syndrome, unspecified type   . Impaired fasting blood sugar   . Family history of premature CAD   . Class 1 obesity with serious comorbidity and body mass index (BMI) of 31.0 to 31.9 in adult, unspecified obesity type   . Screening for prostate cancer     Physical exam - discussed healthy lifestyle, diet, exercise, preventative care, vaccinations, and addressed their concerns.   Routine screening labs today See your eye doctor yearly for routine vision care. See your dentist yearly for routine dental care including hygiene visits twice yearly.  Asthma - mild intermittent, controlled on current medication, sees asthma specialist.  Discussed prostate and testicular cancer screening, risks/benefits IBS - no recent issues Obesity - work on weight loss through healthy diet and exercise.  Vaccines: Advised yearly flu vaccine  Follow-up pending labs  Sharvil was seen today for annual exam.  Diagnoses and all orders for this visit:  Encounter for health maintenance examination in adult -     CBC -     Comprehensive metabolic panel -     Lipid panel -     PSA -     Hemoglobin A1c  Mild intermittent asthma without complication  Allergic rhinitis due  to pollen, unspecified seasonality  Irritable bowel syndrome, unspecified type  Impaired fasting blood sugar -     Hemoglobin A1c  Family history of premature CAD  Class 1 obesity with serious comorbidity and body mass index (BMI) of 31.0 to 31.9 in adult, unspecified obesity type -     Hemoglobin A1c  Screening for prostate cancer -     PSA  Other orders -     albuterol (PROAIR HFA) 108 (90 Base) MCG/ACT inhaler; Inhale 2 puffs into the lungs every 6 (six) hours as needed for wheezing or shortness of breath.

## 2017-02-23 NOTE — Patient Instructions (Signed)
Encounter Diagnoses  Name Primary?  . Encounter for health maintenance examination in adult Yes  . Mild intermittent asthma without complication   . Allergic rhinitis due to pollen, unspecified seasonality   . Irritable bowel syndrome, unspecified type   . Impaired fasting blood sugar   . Family history of premature CAD   . Class 1 obesity with serious comorbidity and body mass index (BMI) of 31.0 to 31.9 in adult, unspecified obesity type   . Screening for prostate cancer    It was good to see you today!  I am glad you are doing well.  Recommendations:  Continue routine exercise  Work on losing some more weight  Set the example in your household for healthy diet, exercise  Check your testicles once monthly for lumps/bumps  We will call with lab results  Get a yearly flu shot in September

## 2017-02-23 NOTE — Addendum Note (Signed)
Addended by: Winn JockVALENTINE, Jakari Sada N on: 02/23/2017 09:44 AM   Modules accepted: Orders

## 2017-02-24 LAB — PSA: PSA: 0.6 ng/mL (ref <=4.0)

## 2017-02-24 LAB — HEMOGLOBIN A1C
Hgb A1c MFr Bld: 6 % — ABNORMAL HIGH (ref <5.7)
MEAN PLASMA GLUCOSE: 126 mg/dL

## 2017-06-23 ENCOUNTER — Other Ambulatory Visit: Payer: Self-pay | Admitting: Medical

## 2017-06-23 NOTE — Telephone Encounter (Signed)
Refill but get in for 53mo f/u

## 2017-07-26 ENCOUNTER — Telehealth: Payer: Self-pay | Admitting: Medical

## 2017-07-26 ENCOUNTER — Ambulatory Visit: Payer: Self-pay | Admitting: Medical

## 2017-07-26 NOTE — Telephone Encounter (Signed)
Forwarding to kathy  

## 2017-07-26 NOTE — Telephone Encounter (Signed)
Call patient given no show today

## 2017-08-05 ENCOUNTER — Encounter: Payer: Self-pay | Admitting: Medical

## 2017-08-17 NOTE — Telephone Encounter (Signed)
Letter with appt request sent

## 2017-08-23 ENCOUNTER — Ambulatory Visit: Payer: 59 | Admitting: Medical

## 2017-09-02 ENCOUNTER — Ambulatory Visit (INDEPENDENT_AMBULATORY_CARE_PROVIDER_SITE_OTHER): Payer: 59 | Admitting: Medical

## 2017-09-02 VITALS — BP 116/70 | HR 67 | Wt 232.2 lb

## 2017-09-02 DIAGNOSIS — Z6831 Body mass index (BMI) 31.0-31.9, adult: Secondary | ICD-10-CM

## 2017-09-02 DIAGNOSIS — E669 Obesity, unspecified: Secondary | ICD-10-CM | POA: Diagnosis not present

## 2017-09-02 DIAGNOSIS — R7301 Impaired fasting glucose: Secondary | ICD-10-CM

## 2017-09-02 LAB — POCT GLYCOSYLATED HEMOGLOBIN (HGB A1C): HEMOGLOBIN A1C: 6.1

## 2017-09-02 MED ORDER — PHENTERMINE HCL 37.5 MG PO TABS: 37.5000 mg | ORAL_TABLET | Freq: Every day | ORAL | 0 refills | Status: DC

## 2017-09-02 NOTE — Patient Instructions (Addendum)
  Thank you for giving me the opportunity to serve you today.    Your diagnosis today includes: Encounter Diagnoses  Name Primary?  . Impaired fasting blood sugar Yes  . Class 1 obesity with serious comorbidity and body mass index (BMI) of 31.0 to 31.9 in adult, unspecified obesity type      Specific recommendations today include: Diet  Increase your water intake, get at least 64 ounces of water daily  Eat 3-4 fruits daily  Eat plenty of vegetables throughout the day, preferably each meal  Eat good sources of grains such as oatmeal, barley, whole grain pasta, whole grain bread, but limit the serving size to 1 cup of oatmeal or pasta per meal or 2 slices of bread per meal  We don't need to meat at each meal, however if you do eat meat, limit serving size to the size of your palm, and eat chicken fish or [place]urkey, lean cuts of meat  Eat beans every day as this is a good nutrient source and helps to curb appetite  Consider using a program such as Weight Watchers  Consider using a Smart phone app such as My Fitness [employer] or [employer] to track your calories and progress   Things to limit or avoid:  Avoid fast food, fried foods, fatty foods  Limit sweets, ice cream, cake and other baked goods  Avoid soda, beer, alcohol, sweet tea  Exercise  You need to be exercising most days of the week for 30-45 minutes or more  Good forms of exercise include walking, hiking, stationary bike or bicycling outside, lap swimming, aerobics class, dance, Zumba  Consider getting a trainer at a gym to help with exercise  Medication  Begin short term trial of Phentermine to suppress appetite   Check on the insurance coverage for the following medications:  Qsymia  Saxenda  Contrave  Consider weighing yourself daily to keep track of your weight

## 2017-09-02 NOTE — Addendum Note (Signed)
Addended by: Winn JockVALENTINE, Aviella Disbrow N on: 09/02/2017 12:26 PM   Modules accepted: Orders

## 2017-09-02 NOTE — Progress Notes (Signed)
Subjective: Chief Complaint  Patient presents with  . Follow-up    follow up and lab work, no other concerns    Here for recheck.   At his 02/2017 physical, his glucose was impaired.  We had discussed diet, exercise, and weight loss recommendations at that time.  [place].  unfortunately he has gained weight since 02/2017.   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     He gets up 4:30am, has to be a work at [employer]6am, and most evenings his 3 kids are involved with sports.  So he doesn't have a lot of time.    He know he could eat more fruit and healthier foods, and he is not exercising much although he has elliptical and treadmill in the house.   Has 78 yo son and 2 older sons.   Past Medical History:  Diagnosis Date  . Asthma   . GERD (gastroesophageal reflux disease)    intermittent  . Migraines    as adult, age 46, usually stress related, no frequent headaches since 04/2014  . Seasonal allergic rhinitis   . Wears glasses    Current Outpatient Medications on File Prior to Visit  Medication Sig Dispense Refill  . albuterol (PROAIR HFA) 108 (90 Base) MCG/ACT inhaler Inhale 2 puffs into the lungs every 6 (six) hours as needed for wheezing or shortness of breath. 18 g 1  . ibuprofen (ADVIL,MOTRIN) 200 MG tablet Take 200 mg by mouth every 6 (six) hours as needed. Reported on 02/23/2016     Current Facility-Administered Medications on File Prior to Visit  Medication Dose Route Frequency Provider Last Rate Last Dose  . gadopentetate dimeglumine (MAGNEVIST) injection 20 mL  20 mL Intravenous Once PRN Penumalli, Vikram R, MD       ROS as in subjective    Objective: BP 116/70   Pulse 67   Wt 232 lb 3.2 oz (105.3 kg)   SpO2 97%   BMI 33.32 kg/m   Gen: wd, wn ,nad Psych: pleasant good eye contact, normal demeanor   Assessment: Encounter  Diagnoses  Name Primary?  . Impaired fasting blood sugar Yes  . Class 1 obesity with serious comorbidity and body mass index (BMI) of 31.0 to 31.9 in adult, unspecified obesity type      Plan: HgbA1C 6.1% today.   discussed risks of prediabetes, obesity, and the 16lb weight gain since 02/2017.  We spent our time today discussing ways he can change his situation.  We discussed healthy foods, trying to get exercise in immediately after he gets home for work or take a walk at lunch to get 150 minutes exercise in per week. dicussed monitoring calories, using pedometer or other incentive devices.  He requested medication to help.   Short term trial of phentermine, discussed risks/benefits of medication.  discussed other medications that can be used for chronic weight management.   He will check insurance coverage.   F/u with call back on medications, physical in 02/2018   Marcial Pacasimothy was seen today for follow-up.  Diagnoses and all orders for this visit:  Impaired fasting blood sugar  Class 1 obesity with serious comorbidity and body mass index (BMI) of 31.0 to 31.9 in adult, unspecified obesity type  Other orders -     phentermine (ADIPEX-P) 37.5 MG tablet; Take 1 tablet (  37.5 mg total) by mouth daily before breakfast.

## 2017-09-28 ENCOUNTER — Telehealth: Payer: Self-pay | Admitting: Medical

## 2017-09-28 NOTE — Telephone Encounter (Signed)
I received a message from his insurer that he was getting frequent albuterol refills.  Is he having a lot of problems with asthma and difficulty breathing?  If he is having to use his inhaler more than 3 days/week for the last few weeks then he should probably come in and discuss other treatment options.  If he is not having this issue then disregard this message

## 2017-09-29 NOTE — Telephone Encounter (Signed)
Did you mean, this was an error?

## 2017-09-29 NOTE — Telephone Encounter (Signed)
This any error.

## 2017-09-30 NOTE — Telephone Encounter (Signed)
yes

## 2017-10-02 ENCOUNTER — Other Ambulatory Visit: Payer: Self-pay | Admitting: Medical

## 2017-10-03 NOTE — Telephone Encounter (Signed)
Is this okay to refill? 

## 2018-02-24 ENCOUNTER — Encounter: Payer: 59 | Admitting: Medical

## 2018-02-27 ENCOUNTER — Ambulatory Visit (INDEPENDENT_AMBULATORY_CARE_PROVIDER_SITE_OTHER): Payer: 59 | Admitting: Medical

## 2018-02-27 ENCOUNTER — Encounter: Payer: Self-pay | Admitting: Medical

## 2018-02-27 VITALS — BP 120/74 | HR 72 | Temp 98.0°F | Resp 16 | Ht 70.0 in | Wt 208.0 lb

## 2018-02-27 DIAGNOSIS — J301 Allergic rhinitis due to pollen: Secondary | ICD-10-CM

## 2018-02-27 DIAGNOSIS — R7301 Impaired fasting glucose: Secondary | ICD-10-CM | POA: Diagnosis not present

## 2018-02-27 DIAGNOSIS — J4520 Mild intermittent asthma, uncomplicated: Secondary | ICD-10-CM | POA: Diagnosis not present

## 2018-02-27 DIAGNOSIS — Z0000 Encounter for general adult medical examination without abnormal findings: Secondary | ICD-10-CM | POA: Diagnosis not present

## 2018-02-27 DIAGNOSIS — Z8249 Family history of ischemic heart disease and other diseases of the circulatory system: Secondary | ICD-10-CM | POA: Diagnosis not present

## 2018-02-27 DIAGNOSIS — K589 Irritable bowel syndrome without diarrhea: Secondary | ICD-10-CM

## 2018-02-27 LAB — HEMOGLOBIN A1C
Est. average glucose Bld gHb Est-mCnc: 123 mg/dL
Hgb A1c MFr Bld: 5.9 % — ABNORMAL HIGH (ref 4.8–5.6)

## 2018-02-27 LAB — POCT URINALYSIS DIP (PROADVANTAGE DEVICE)
BILIRUBIN UA: NEGATIVE
Blood, UA: NEGATIVE
Glucose, UA: NEGATIVE mg/dL
Ketones, POC UA: NEGATIVE mg/dL
Leukocytes, UA: NEGATIVE
NITRITE UA: NEGATIVE
PH UA: 6 (ref 5.0–8.0)
PROTEIN UA: NEGATIVE mg/dL
Specific Gravity, Urine: 1.01
Urobilinogen, Ur: 3.5

## 2018-02-27 NOTE — Patient Instructions (Signed)
Congratulations on losing weight and making healthy choices with food and exercise  Keep up the good work, and be the role model and example for you family both with fitness as well as spiritually  See your eye doctor yearly for routine vision care.  See your dentist yearly for routine dental care including hygiene visits twice yearly.

## 2018-02-27 NOTE — Progress Notes (Signed)
Subjective:   HPI  Mark Arroyo is a 47 y.o. male who presents for a complete physical.   Medical team:  [facility]  Sees eye doctor and dentist  Dr. Melvenia BeamMitchell Gore, ENT Cevin Rubinstein, Kermit Baloavid S, PA-C here for primary care   Concerns: Eating more fruits and vegetables, drinking way more water than he was.   Exercising at least 3 days per week. Has cut out fried foods.  Instead of snacks at work, eating water or fruit.  Drinking 16oz water every 2 hours.  Prior when he would play basketball with his sons, would have knee pains.  Asthma - no recent issues.    Hx/o IBS, but no frequent issues.   Reviewed their medical, surgical, family, social, medication, and allergy history and updated chart as appropriate.  Past Medical History:  Diagnosis Date  . Asthma   . GERD (gastroesophageal reflux disease)    intermittent  . Migraines    as adult, age 47, usually stress related, no frequent headaches since 04/2014  . Seasonal allergic rhinitis   . Wears glasses     Past Surgical History:  Procedure Laterality Date  . ANTERIOR CERVICAL DECOMP/DISCECTOMY FUSION  2013   Dr. Yevette Edwardsumonski  . COLONOSCOPY  38   due to stomach issues, normal, [place]  . INGUINAL HERNIA REPAIR  2003   right  . MEDIAL COLLATERAL LIGAMENT REPAIR, KNEE  2011   left  . NASAL SINUS SURGERY  06/25/15   x2,   . SHOULDER SURGERY Left 08/2013   left, labrum repair, RTC repair, spur removal;   . VASECTOMY  2013    Social History   Socioeconomic History  . Marital status: Married    Spouse name: Gust Rungyesha  . Number of children: 4  . Years of education: 2914  . Highest education level: Not on file  Occupational History    Comment: [employer]  Social Needs  . Financial resource strain: Not on file  . Food insecurity:    Worry: Not on file    Inability: Not on file  . Transportation needs:    Medical: Not on file    Non-medical: Not on file  Tobacco Use   . Smoking status: Never Smoker  . Smokeless tobacco: Never Used  Substance and Sexual Activity  . Alcohol use: No  . Drug use: No  . Sexual activity: Not on file  Lifestyle  . Physical activity:    Days per week: Not on file    Minutes per session: Not on file  . Stress: Not on file  Relationships  . Social connections:    Talks on phone: Not on file    Gets together: Not on file    Attends religious service: Not on file    Active member of club or organization: Not on file    Attends meetings of clubs or organizations: Not on file    Relationship status: Not on file  . Intimate partner violence:    Fear of current or ex partner: Not on file    Emotionally abused: Not on file    Physically abused: Not on file    Forced sexual activity: Not on file  Other Topics Concern  . Not on file  Social History Narrative   Married, 4 sons, human resources for [employer], exercise -variety of things, cardio, weight bearing, 4 days per week.  Christian.  Hobbies - likes basketball, video games.  Caffeine use- varies,  soda and tea occ, not daily.  As of 02/2018    Family History  Problem Relation Age of Onset  . Diabetes Mother   . Heart disease Father 39       MI  . Prostatitis Father   . Kidney disease Sister   . Diabetes Brother   . Cancer Neg Hx      Current Outpatient Medications:  .  ibuprofen (ADVIL,MOTRIN) 200 MG tablet, Take 200 mg by mouth every 6 (six) hours as needed. Reported on 02/23/2016, Disp: , Rfl:  .  PROAIR HFA 108 (90 Base) MCG/ACT inhaler, TAKE 2 PUFFS BY MOUTH EVERY 6 HOURS AS NEEDED FOR WHEEZE OR SHORTNESS OF BREATH, Disp: 17 Inhaler, Rfl: 1 No current facility-administered medications for this visit.   Facility-Administered Medications Ordered in Other Visits:  .  gadopentetate dimeglumine (MAGNEVIST) injection 20 mL, 20 mL, Intravenous, Once PRN, Penumalli, Vikram R, MD  No Known Allergies   Review of Systems Constitutional: -fever, -chills,  -sweats, -unexpected weight change, -decreased appetite, -fatigue Allergy: -sneezing, -itching, -congestion Dermatology: -changing moles, --rash, -lumps ENT: -runny nose, -ear pain, -sore throat, -hoarseness, -sinus pain, -teeth pain, - ringing in ears, -hearing loss, -nosebleeds Cardiology: -chest pain, -palpitations, -swelling, -difficulty breathing when lying flat, -waking up short of breath Respiratory: -cough, -shortness of breath, -difficulty breathing with exercise or exertion, -wheezing, -coughing up blood Gastroenterology: -abdominal pain, -nausea, -vomiting, +diarrhea, -constipation, -blood in stool, +changes in bowel movement, -difficulty swallowing or eating Hematology: -bleeding, -bruising  Musculoskeletal: -joint aches, -muscle aches, -joint swelling, -back pain, -neck pain, -cramping, -changes in gait Ophthalmology: denies vision changes, eye redness, itching, discharge Urology: -burning with urination, -difficulty urinating, -blood in urine, -urinary frequency, -urgency, -incontinence Neurology: -headache, -weakness, -tingling, -numbness, -memory loss, -falls, -dizziness Psychology: -depressed mood, -agitation, -sleep problems      Objective:   Physical Exam  BP 120/74   Pulse 72   Temp 98 F (36.7 C) (Oral)   Resp 16   Ht 5\' 10"  (1.778 m)   Wt 208 lb (94.3 kg)   SpO2 98%   BMI 29.84 kg/m   General appearance: alert, no distress, WD/WN, AA male Skin: 2 small flat brown birth marks <4cm diameter in left and low back, few scattered macules, no worrisome lesions, right upper arm laterally with dagger tattoo HEENT: normocephalic, conjunctiva/corneas normal, sclerae anicteric, PERRLA, EOMi, nares patent, no discharge or erythema, pharynx normal Oral cavity: MMM, tongue normal, teeth in good repair Neck: faint anterior neck surgical scar, supple, no lymphadenopathy, no thyromegaly, no masses, normal ROM, no bruits Chest: non tender, normal shape and expansion Heart: RRR,  normal S1, S2, no murmurs Lungs: CTA bilaterally, no wheezes, rhonchi, or rales Abdomen: +bs, soft, non tender, non distended, no masses, no hepatomegaly, no splenomegaly, no bruits Back: non tender, normal ROM, no scoliosis Musculoskeletal: port surgical scars of left shoulder and left knee, upper extremities non tender, no obvious deformity, normal ROM throughout, lower extremities non tender, no obvious deformity, normal ROM throughout Extremities: no edema, no cyanosis, no clubbing Pulses: 2+ symmetric, upper and lower extremities, normal cap refill Neurological: alert, oriented x 3, CN2-12 intact, strength normal upper extremities and lower extremities, sensation normal throughout, DTRs 2+ throughout, no cerebellar signs, gait normal Psychiatric: normal affect, behavior normal, pleasant  GU: normal male external genitalia, circumcised, nontender, left inguinal hernia surgical scar, no masses, no hernia, no lymphadenopathy Rectal: deferred   Assessment and Plan :    Encounter Diagnoses  Name Primary?  . Routine  general medical examination at a health care facility Yes  . Encounter for health maintenance examination in adult   . Family history of premature CAD   . Impaired fasting blood sugar   . Irritable bowel syndrome, unspecified type   . Mild intermittent asthma without complication   . Allergic rhinitis due to pollen, unspecified seasonality     Physical exam - discussed healthy lifestyle, diet, exercise, preventative care, vaccinations, and addressed their concerns.   Routine screening labs today See your eye doctor yearly for routine vision care. See your dentist yearly for routine dental care including hygiene visits twice yearly.  Asthma - mild intermittent, controlled on current medication, sees asthma specialist.  IBS - no recent issues  Impaired glucose - glad to see he has lost weight.  Repeat HgbA1C today.  Congratulated him on losing weight and making healthy  choices.   Encouraged him to keep up the good work and maintain his weight or continue with additional fitness and weight goals.  Vaccines: Advised yearly flu vaccine  Follow-up pending labs  Camerin was seen today for annual exam.  Diagnoses and all orders for this visit:  Routine general medical examination at a health care facility -     POCT Urinalysis DIP (Proadvantage Device) -     Hemoglobin A1c  Encounter for health maintenance examination in adult  Family history of premature CAD  Impaired fasting blood sugar -     Hemoglobin A1c  Irritable bowel syndrome, unspecified type  Mild intermittent asthma without complication  Allergic rhinitis due to pollen, unspecified seasonality

## 2018-03-19 ENCOUNTER — Other Ambulatory Visit: Payer: Self-pay | Admitting: Medical

## 2018-03-20 NOTE — Telephone Encounter (Signed)
Decline.  He sees asthma specialist.

## 2018-03-20 NOTE — Telephone Encounter (Signed)
Is this ok to refill?  

## 2018-04-27 ENCOUNTER — Other Ambulatory Visit: Payer: Self-pay | Admitting: Podiatry

## 2018-04-27 ENCOUNTER — Encounter: Payer: Self-pay | Admitting: Podiatry

## 2018-04-27 ENCOUNTER — Ambulatory Visit (INDEPENDENT_AMBULATORY_CARE_PROVIDER_SITE_OTHER): Payer: 59 | Admitting: Podiatry

## 2018-04-27 ENCOUNTER — Ambulatory Visit (INDEPENDENT_AMBULATORY_CARE_PROVIDER_SITE_OTHER): Payer: 59

## 2018-04-27 VITALS — BP 120/82 | HR 69

## 2018-04-27 DIAGNOSIS — M7751 Other enthesopathy of right foot: Secondary | ICD-10-CM

## 2018-04-27 DIAGNOSIS — M779 Enthesopathy, unspecified: Secondary | ICD-10-CM

## 2018-04-27 DIAGNOSIS — M79671 Pain in right foot: Secondary | ICD-10-CM | POA: Diagnosis not present

## 2018-04-27 MED ORDER — TRIAMCINOLONE ACETONIDE 10 MG/ML IJ SUSP
10.0000 mg | Freq: Once | INTRAMUSCULAR | Status: AC
  Administered 2018-04-27: 10 mg

## 2018-04-27 MED ORDER — DICLOFENAC SODIUM 75 MG PO TBEC: 75.0000 mg | DELAYED_RELEASE_TABLET | Freq: Two times a day (BID) | ORAL | 2 refills | Status: DC

## 2018-04-30 NOTE — Progress Notes (Signed)
Subjective:   Patient ID: Mark Arroyo, male   DOB: 47 y.o.   MRN: [id number]   HPI She states that he hurt his right foot around 2 months ago playing basketball and that it sharp pain and it is difficult for him to walk on it and is walking on the side of his foot with pain gradually getting worse over this period of time.  Did not hear a pop or any other injury and patient does not smoke and likes to be active   Review of Systems  All other systems reviewed and are negative.       Objective:  Physical Exam  Constitutional: He appears well-developed and well-nourished.  Cardiovascular: Intact distal pulses.  Pulmonary/Chest: Effort normal.  Musculoskeletal: Normal range of motion.  Neurological: He is alert.  Skin: Skin is warm.  Nursing note and vitals reviewed.   Neurovascular status intact muscle strength is adequate range of motion within normal limits with patient found to have inflammation pain of the second metatarsal phalangeal joint with fluid around the joint that is painful when pressed and makes walking difficult.  Patient is found to have good digital perfusion and is well oriented x3     Assessment:  Acute capsulitis second MPJ right with inflammation fluid of the metatarsal phalangeal joint     Plan:  H&P condition reviewed and recommended conservative treatment explaining inflammatory capsulitis.  I reviewed his x-ray and then I went ahead did a proximal nerve block did a sterile prep of the right forefoot aspirated the second MPJ getting out clear fluid and injected with quarter cc of dexamethasone Kenalog.  Patient had padding applied we will start diclofenac 75 mg twice daily and be seen back in 2 weeks.  If the medication bothers his stomach he will stop it he states he can take anti-inflammatories without irritation  X-rays were negative for signs of fracture or arthritic condition

## 2018-05-02 ENCOUNTER — Ambulatory Visit: Payer: 59 | Admitting: Podiatry

## 2018-05-17 ENCOUNTER — Other Ambulatory Visit: Payer: Self-pay | Admitting: Medical

## 2018-05-17 NOTE — Telephone Encounter (Signed)
Is this ok to refill?  

## 2018-05-18 ENCOUNTER — Encounter: Payer: Self-pay | Admitting: Podiatry

## 2018-05-18 ENCOUNTER — Ambulatory Visit (INDEPENDENT_AMBULATORY_CARE_PROVIDER_SITE_OTHER): Payer: 59 | Admitting: Podiatry

## 2018-05-18 DIAGNOSIS — M779 Enthesopathy, unspecified: Secondary | ICD-10-CM

## 2018-05-18 MED ORDER — TRIAMCINOLONE ACETONIDE 10 MG/ML IJ SUSP
10.0000 mg | Freq: Once | INTRAMUSCULAR | Status: AC
  Administered 2018-05-18: 10 mg

## 2018-05-20 NOTE — Progress Notes (Signed)
Subjective:   Patient ID: Mark Arroyo, male   DOB: 47 y.o.   MRN: [id number]   HPI Patient presents stating he had several days of relief followed by recurrence of the pain in his right foot and it seems good in the area where we did the initial injection but it seems to be more around the big toe joint   ROS      Objective:  Physical Exam  Neurovascular status intact with patient second MPJ much better than previous with discomfort around the first MPJ with fluid buildup around the joint surface     Assessment:  Inflammatory capsulitis first MPJ right with improved capsulitis second MPJ     Plan:  H&P condition reviewed and careful injection around the first MPJ administered 3 mg Kenalog 5 mg Xylocaine and advised on reduced activity and that this should solve his problem and reappoint if symptoms persist

## 2018-06-16 ENCOUNTER — Encounter: Payer: Self-pay | Admitting: Medical

## 2018-06-16 ENCOUNTER — Ambulatory Visit (INDEPENDENT_AMBULATORY_CARE_PROVIDER_SITE_OTHER): Payer: 59 | Admitting: Medical

## 2018-06-16 VITALS — BP 120/76 | HR 63 | Temp 98.0°F | Resp 16 | Ht 71.0 in | Wt 208.8 lb

## 2018-06-16 DIAGNOSIS — M7062 Trochanteric bursitis, left hip: Secondary | ICD-10-CM | POA: Diagnosis not present

## 2018-06-16 NOTE — Progress Notes (Signed)
Subjective: Chief Complaint  Patient presents with  . hip pain    hip pain left X 2 weeks off and on 2 months   Here for left hip pain x 2 months intermittent, worse in last 2 weeks, some pain down into left thigh.   Sitting at desk, gets pain and throbbing of left hip pain 10 minutes after sitting.  Hurts like a knot in leg when trying to get up and move.   Sometimes activity can help the pain, but walking causes the pain.  No fever.   No recent fall, trauma, or injury.  No low back pain.   Using Ibuprofen occasionally.  Has been using Diclofenac per podiatry BID the last few weeks. No other aggravating or relieving factors. No other complaint.  Past Medical History:  Diagnosis Date  . Asthma   . GERD (gastroesophageal reflux disease)    intermittent  . Migraines    as adult, age 47, usually stress related, no frequent headaches since 04/2014  . Seasonal allergic rhinitis   . Wears glasses    Past Surgical History:  Procedure Laterality Date  . ANTERIOR CERVICAL DECOMP/DISCECTOMY FUSION  2013   Dr. Yevette Edwards  . COLONOSCOPY  38   due to stomach issues, normal, [place]  . INGUINAL HERNIA REPAIR  2003   right  . MEDIAL COLLATERAL LIGAMENT REPAIR, KNEE  2011   left  . NASAL SINUS SURGERY  06/25/15   x2,   . SHOULDER SURGERY Left 08/2013   left, labrum repair, RTC repair, spur removal; [place]  . VASECTOMY  2013   ROS as in subjective   Objective: BP 120/76   Pulse 63   Temp 98 F (36.7 C) (Oral)   Resp 16   Ht 5\' 11"  (1.803 m)   Wt 208 lb 12.8 oz (94.7 kg)   SpO2 97%   BMI 29.12 kg/m   Gen: wd, wn, nad Skin: Unremarkable Tender over left greater trochanteric bursa, otherwise leg and hip nontender, normal hip range of motion, no other swelling or deformity.  Rest of legs nontender with normal range of motion Legs neurovascularly intact Low back nontender Abdomen nontender, no mass no organomegaly   Assessment: Encounter Diagnosis  Name Primary?  .  Trochanteric bursitis of left hip Yes    Plan: Discussed symptoms and exam findings.  He is already been taking anti-inflammatory prescription strength without improvement.  The pain is isolated to the morning time.  He may bring me back a form allowing him to stand at his desk or use a stand-up desk.  We discussed other treatment options.  Discussed risk and benefits of procedure.  Patient gives consent.  Cleaned and prepped area in usual sterile fashion.   Injected the left trochanteric bursa with 40 mg of triamcinolone or 20mg  Depo Medrol in 3 cc of 1% Lidocaine with a 20-gauge needle (10cc syringe).  Estimated blood loss less than 1 cc, patient tolerated procedure well.  Discussed aftercare.    F/u with call back next week

## 2018-06-19 ENCOUNTER — Ambulatory Visit (INDEPENDENT_AMBULATORY_CARE_PROVIDER_SITE_OTHER): Payer: 59 | Admitting: Medical

## 2018-06-19 ENCOUNTER — Encounter: Payer: Self-pay | Admitting: Medical

## 2018-06-19 ENCOUNTER — Ambulatory Visit
Admission: RE | Admit: 2018-06-19 | Discharge: 2018-06-19 | Disposition: A | Discharge status: Home / Self Care | Payer: Self-pay | Source: Ambulatory Visit | Attending: Medical | Admitting: Medical

## 2018-06-19 VITALS — BP 130/82 | HR 80 | Temp 98.4°F | Resp 16 | Ht 71.0 in | Wt 208.0 lb

## 2018-06-19 DIAGNOSIS — M79605 Pain in left leg: Secondary | ICD-10-CM

## 2018-06-19 DIAGNOSIS — M25552 Pain in left hip: Secondary | ICD-10-CM

## 2018-06-19 DIAGNOSIS — M5442 Lumbago with sciatica, left side: Secondary | ICD-10-CM | POA: Diagnosis not present

## 2018-06-19 DIAGNOSIS — M5441 Lumbago with sciatica, right side: Secondary | ICD-10-CM

## 2018-06-19 NOTE — Progress Notes (Signed)
Subjective: Chief Complaint  Patient presents with  . back pain    back pain X saturday hip pain   Here for back and leg pain.   I saw him last week for trochanteric bursitis.    He notes that part of his leg is improved, but although he didn't c/o back pain last week, he notes over the weekend has had a lot of back pain, left hip and generalized leg pain, and notes having pains for weeks.   He works 2 jobs.   Didn't go to second job Saturday due to pain.   Not in pain when at home.  Most of the time has legs up and doesn't have the pain at home he does at work.  At work gets pain.    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  Sit down job.   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  Works 2nd job on Saturdays 6-7 hours.   Working 6 days per week lately as this is busier season.  sometimes works 7 days per week.     Exercise - nothing currently as he is still dealing with foot issue.   Seeing podiatrist for foot issues.  Has him on Diclofenac.   No recent injury, fall, trauma.   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  No other aggravating or relieving factors. No other complaint.   Past Medical History:  Diagnosis Date  . Asthma   . GERD (gastroesophageal reflux disease)    intermittent  . Migraines    as adult, age 79, usually stress related, no frequent headaches since 04/2014  . Seasonal allergic rhinitis   . Wears glasses    Past Surgical History:  Procedure Laterality Date  . ANTERIOR CERVICAL DECOMP/DISCECTOMY FUSION  2013   Dr. Yevette Edwards  . COLONOSCOPY  38   due to stomach issues, normal, [place]  . INGUINAL HERNIA REPAIR  2003   right  . MEDIAL COLLATERAL LIGAMENT REPAIR, KNEE  2011   left  . NASAL SINUS SURGERY  06/25/15   x2,   . SHOULDER SURGERY Left 08/2013   left, labrum repair, RTC repair, spur removal; [place]  . VASECTOMY  2013   ROS as  in subjective   Objective: BP 130/82   Pulse 80   Temp 98.4 F (36.9 C) (Oral)   Resp 16   Ht 5\' 11"  (1.803 m)   Wt 208 lb (94.3 kg)   SpO2 98%   BMI 29.01 kg/m   Gen: wd, wn, nad Skin: unremarkable Tender lumbar paraspinal area.,  Tender lumbar midline, tender along left lateral hip, non tender today along left greater trochanter Right hip with seemingly normal range of motion without pain, but seems a little tight in the left hip range of motion but relatively full otherwise legs non tender without swelling or deformity Unable to do toe walk, mild pain with heel walk, negative straight leg raise, normal strength and sensation DTRs of legs    Assessment: Encounter Diagnoses  Name Primary?  . Acute bilateral low back pain with bilateral sciatica Yes  . Pain of left hip joint   . Pain of left lower extremity      Plan: We will complete his form for stand-up desk at work, go for x-rays as below, will likely refer to physical therapy or chiropractic therapy.  Once we get some  improvement on the pain I advised he will need to do a daily stretching routine, work on getting regular exercise both aerobic and weightbearing.  Advised he how more downtime a rest time between his schedule as he is working 6 or 7 days/week currently   Bobie was seen today for back pain.  Diagnoses and all orders for this visit:  Acute bilateral low back pain with bilateral sciatica -     DG Lumbar Spine Complete; Future -     DG HIP UNILAT WITH PELVIS 2-3 VIEWS LEFT; Future  Pain of left hip joint -     DG Lumbar Spine Complete; Future -     DG HIP UNILAT WITH PELVIS 2-3 VIEWS LEFT; Future  Pain of left lower extremity -     DG Lumbar Spine Complete; Future -     DG HIP UNILAT WITH PELVIS 2-3 VIEWS LEFT; Future

## 2018-06-27 ENCOUNTER — Telehealth: Payer: Self-pay | Admitting: Internal Medicine

## 2018-06-27 NOTE — Telephone Encounter (Signed)
Called patient and gave him [facility] phone number.

## 2018-06-27 NOTE — Telephone Encounter (Signed)
Pt called asking if he was going to be referred to PT. Please advise

## 2018-07-11 ENCOUNTER — Other Ambulatory Visit: Payer: Self-pay | Admitting: Sports Medicine

## 2018-07-11 MED ORDER — DICLOFENAC SODIUM 75 MG PO TBEC: 75.0000 mg | DELAYED_RELEASE_TABLET | Freq: Two times a day (BID) | ORAL | 2 refills | Status: DC

## 2018-07-18 ENCOUNTER — Other Ambulatory Visit: Payer: Self-pay | Admitting: Medical

## 2018-07-18 NOTE — Telephone Encounter (Signed)
Is this ok to refill?  

## 2018-08-10 ENCOUNTER — Telehealth: Payer: Self-pay | Admitting: Medical

## 2018-08-10 NOTE — Telephone Encounter (Signed)
Dismissal letter in [employer]  °

## 2019-01-19 ENCOUNTER — Other Ambulatory Visit: Payer: Self-pay | Admitting: Medical

## 2019-04-26 ENCOUNTER — Other Ambulatory Visit: Payer: Self-pay | Admitting: Medical

## 2019-05-12 ENCOUNTER — Other Ambulatory Visit: Payer: Self-pay | Admitting: Medical

## 2019-08-08 IMAGING — CR DG LUMBAR SPINE COMPLETE 4+V
5 series · 5 of 5 positions shown · non-contrast
Comparison: Coronal and sagittal reconstructed images through the
lumbar spine from an abdominal and pelvic CT scan July 03, 2010

CLINICAL DATA: Left-sided low back and posterior hip pain for the
past 2-3 weeks. No known injury. Pain extends inferior to the
posterior aspect of the left knee.

EXAM:
LUMBAR SPINE - COMPLETE 4+ VIEW

[t l-spine a.p.]
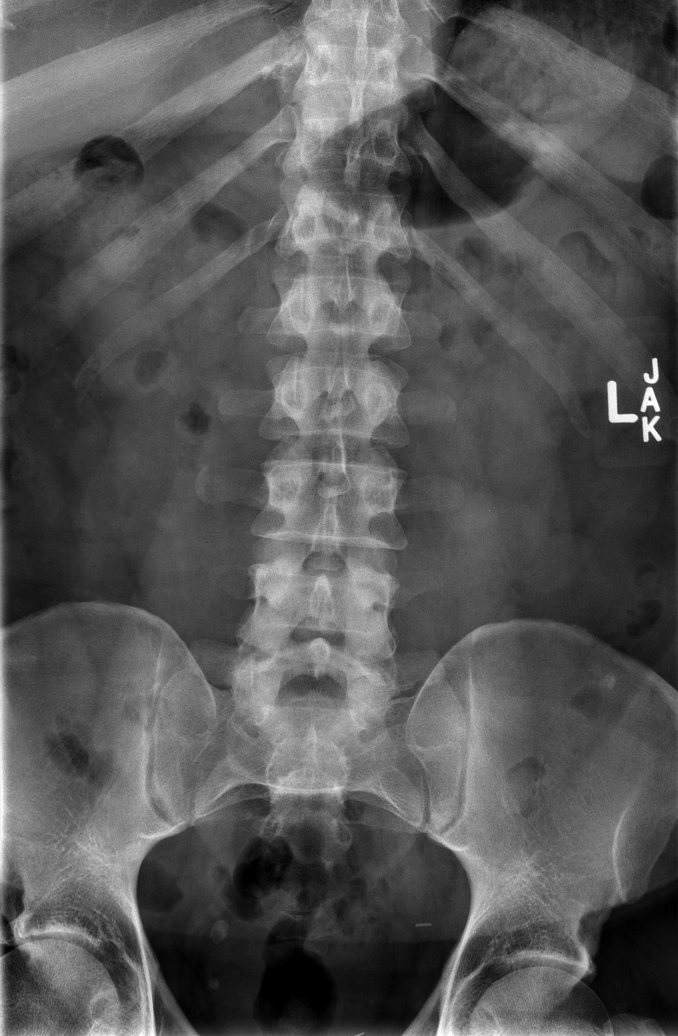

[t l-spine oblique exposure (1 of 2)]
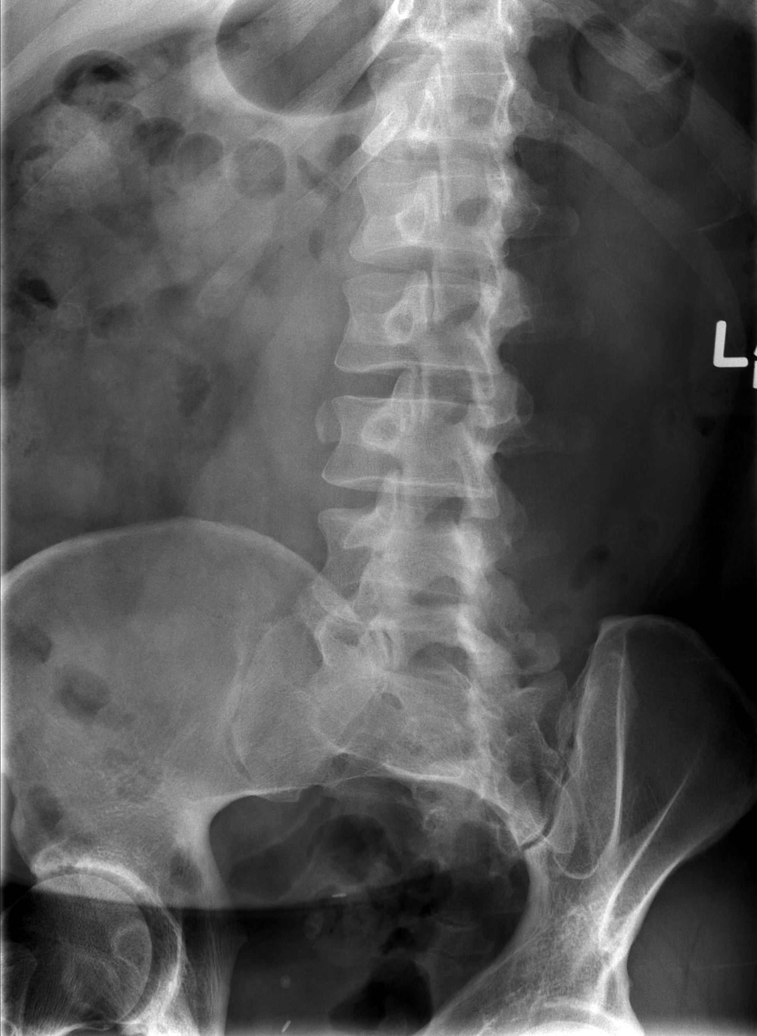

[t l-spine oblique exposure (2 of 2)]
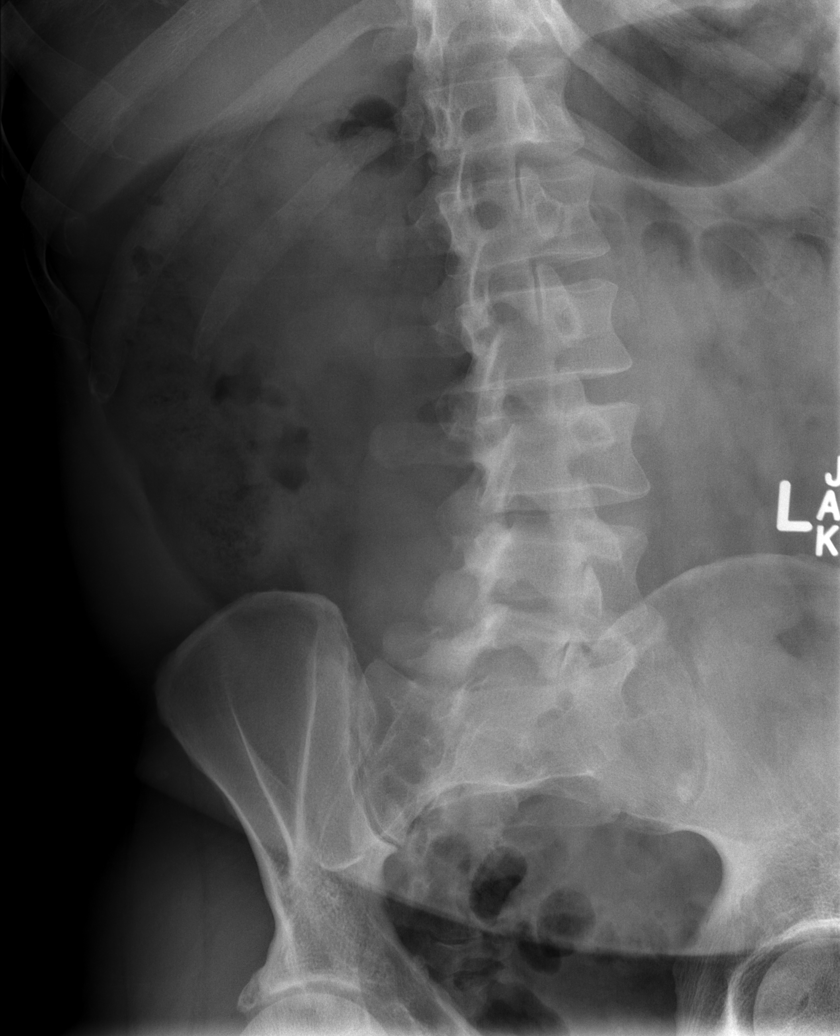

[t l-spine lat]
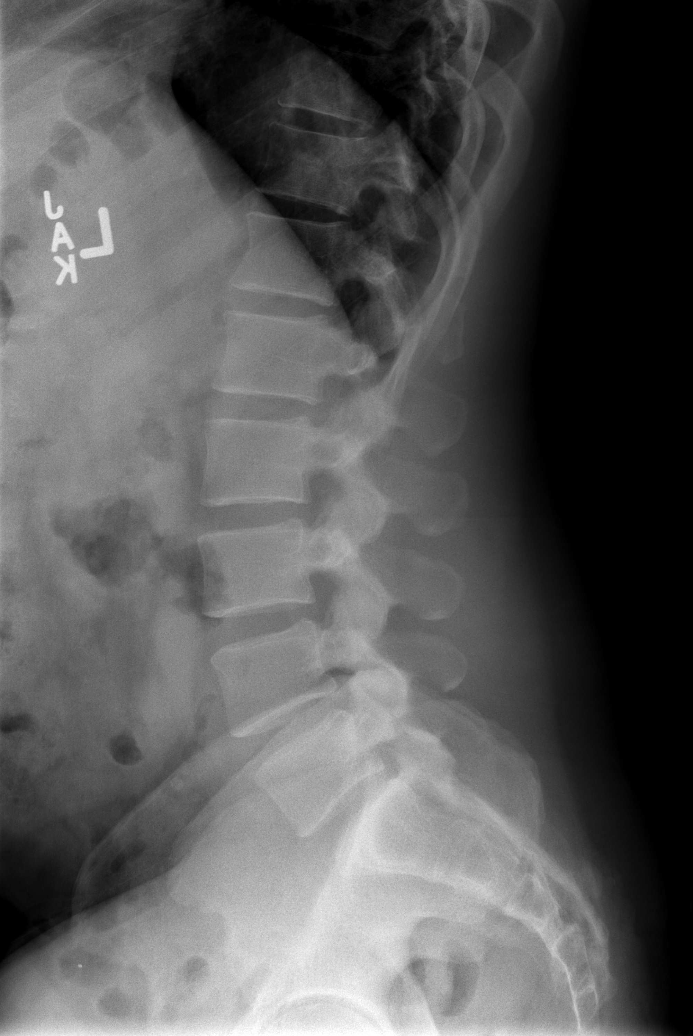

[t l-spine l5-s1 spot]
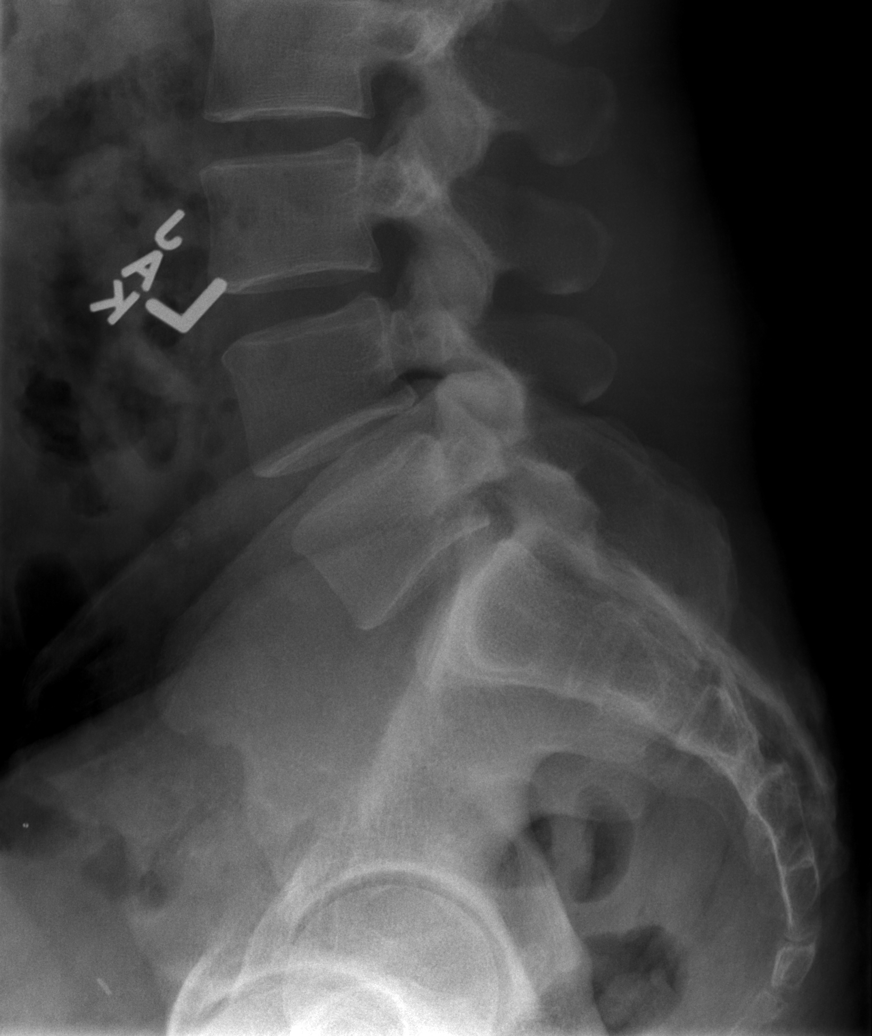

[5 of 5 positions shown; findings below may reference images not displayed]

FINDINGS: The lumbar vertebral bodies are preserved in height. The disc space
heights are well maintained. There is no spondylolisthesis. The
pedicles and transverse processes are intact. The observed portions
of the sacrum are normal.
IMPRESSION: There is no acute or significant chronic bony abnormality of the
lumbar spine.

## 2020-05-15 ENCOUNTER — Encounter: Payer: Self-pay | Admitting: Medical

## 2020-05-15 ENCOUNTER — Telehealth (INDEPENDENT_AMBULATORY_CARE_PROVIDER_SITE_OTHER): Payer: No Typology Code available for payment source | Admitting: Medical

## 2020-05-15 VITALS — Ht 71.0 in | Wt 228.0 lb

## 2020-05-15 DIAGNOSIS — J0110 Acute frontal sinusitis, unspecified: Secondary | ICD-10-CM | POA: Diagnosis not present

## 2020-05-15 DIAGNOSIS — J3489 Other specified disorders of nose and nasal sinuses: Secondary | ICD-10-CM

## 2020-05-15 MED ORDER — AMOXICILLIN 875 MG PO TABS: 875.0000 mg | ORAL_TABLET | Freq: Two times a day (BID) | ORAL | 0 refills | Status: DC

## 2020-05-15 NOTE — Progress Notes (Signed)
Subjective:     Patient ID: Mark Arroyo, male   DOB: [birth year], 49 y.o.   MRN: [id number]  This visit type was conducted due to national recommendations for restrictions regarding the COVID-19 Pandemic (e.g. social distancing) in an effort to limit this patient's exposure and mitigate transmission in our community.  Due to their co-morbid illnesses, this patient is at least at moderate risk for complications without adequate follow up.  This format is felt to be most appropriate for this patient at this time.    Documentation for virtual audio and video telecommunications through [place] encounter:  The patient was located at home. The provider was located in the office. The patient did consent to this visit and is aware of possible charges through their insurance for this visit.  The other persons participating in this telemedicine service were none. Time spent on call was 20 minutes and in review of previous records 20 minutes total.  This virtual service is not related to other E/M service within previous 7 days.   HPI Chief Complaint  Patient presents with  . Sinus Problem    runny nose, congestion    Virtual consult for sinus pressure, possible sinus infection.   Started Monday 4 days ago.   He notes sinus pressure, congestion, yellow mucous.  No sore throat, no cough.   No fever. No nausea, no vomiting.  He has some change in smell from all the mucous.  When he gets some mucous out, can smell ok.  No diarrhea.  Had mild headache last week.  Using diffuse with peppermint.  No sick contacts.  No recent covid test.  Hasn't had covid vaccine.  No other aggravating or relieving factors. No other complaint.   Past Medical History:  Diagnosis Date  . Asthma   . GERD (gastroesophageal reflux disease)    intermittent  . Migraines    as adult, age 20, usually stress related, no frequent headaches since 04/2014  . Seasonal allergic rhinitis   . Wears glasses    Current  Outpatient Medications on File Prior to Visit  Medication Sig Dispense Refill  . albuterol (PROVENTIL HFA;VENTOLIN HFA) 108 (90 Base) MCG/ACT inhaler TAKE 2 PUFFS BY MOUTH EVERY 6 HOURS AS NEEDED FOR WHEEZE OR SHORTNESS OF BREATH 17 Inhaler 1  . diclofenac (VOLTAREN) 75 MG EC tablet Take 1 tablet (75 mg total) by mouth 2 (two) times daily. (Patient not taking: Reported on 05/15/2020) 60 tablet 2  . ibuprofen (ADVIL,MOTRIN) 200 MG tablet Take 200 mg by mouth every 6 (six) hours as needed. Reported on 02/23/2016 (Patient not taking: Reported on 05/15/2020)     Current Facility-Administered Medications on File Prior to Visit  Medication Dose Route Frequency Provider Last Rate Last Admin  . gadopentetate dimeglumine (MAGNEVIST) injection 20 mL  20 mL Intravenous Once PRN Penumalli, Vikram R, MD         Review of Systems As in subjective    Objective:   Physical Exam Due to coronavirus pandemic stay at home measures, patient visit was virtual and they were not examined in person.   Ht 5\' 11"  (1.803 m)   Wt 228 lb (103.4 kg)   BMI 31.80 kg/m       Assessment:     Encounter Diagnoses  Name Primary?  . Sinus pressure Yes  . Acute non-recurrent frontal sinusitis        Plan:     Discussed symptoms, conveners.  Advised that I can't rule out covid, so  offered testing.  He declines for now.  He has very specific sinusitis symptoms.  Begin medicaiton below.  Advised rest, hydration, nasal saline.   If not much improved within the next 7 days, call back.    Discussed symptoms of covid vs sinusitis.     Discussed precautions and prevention of spread to others.    Fitzpatrick was seen today for sinus problem.  Diagnoses and all orders for this visit:  Sinus pressure  Acute non-recurrent frontal sinusitis  Other orders -     amoxicillin (AMOXIL) 875 MG tablet; Take 1 tablet (875 mg total) by mouth 2 (two) times daily.  f/u if not improving within the next week

## 2020-06-11 ENCOUNTER — Other Ambulatory Visit: Payer: Self-pay

## 2020-06-11 ENCOUNTER — Ambulatory Visit (INDEPENDENT_AMBULATORY_CARE_PROVIDER_SITE_OTHER): Payer: No Typology Code available for payment source | Admitting: Medical

## 2020-06-11 ENCOUNTER — Encounter: Payer: Self-pay | Admitting: Medical

## 2020-06-11 VITALS — BP 108/80 | HR 81 | Ht 71.0 in | Wt 231.8 lb

## 2020-06-11 DIAGNOSIS — J4520 Mild intermittent asthma, uncomplicated: Secondary | ICD-10-CM

## 2020-06-11 DIAGNOSIS — Z0000 Encounter for general adult medical examination without abnormal findings: Secondary | ICD-10-CM | POA: Diagnosis not present

## 2020-06-11 DIAGNOSIS — J301 Allergic rhinitis due to pollen: Secondary | ICD-10-CM

## 2020-06-11 DIAGNOSIS — Z125 Encounter for screening for malignant neoplasm of prostate: Secondary | ICD-10-CM | POA: Diagnosis not present

## 2020-06-11 DIAGNOSIS — Z8249 Family history of ischemic heart disease and other diseases of the circulatory system: Secondary | ICD-10-CM

## 2020-06-11 DIAGNOSIS — Z2821 Immunization not carried out because of patient refusal: Secondary | ICD-10-CM

## 2020-06-11 DIAGNOSIS — K589 Irritable bowel syndrome without diarrhea: Secondary | ICD-10-CM

## 2020-06-11 DIAGNOSIS — R7301 Impaired fasting glucose: Secondary | ICD-10-CM

## 2020-06-11 MED ORDER — ALBUTEROL SULFATE HFA 108 (90 BASE) MCG/ACT IN AERS: INHALATION_SPRAY | RESPIRATORY_TRACT | 1 refills | Status: DC

## 2020-06-11 MED ORDER — ASMANEX (30 METERED DOSES) 110 MCG/INH IN AEPB: 1.0000 | INHALATION_SPRAY | Freq: Every day | RESPIRATORY_TRACT | 0 refills | Status: DC

## 2020-06-11 NOTE — Progress Notes (Signed)
Complete physical exam   Patient: Mark Arroyo   DOB: 02-Aug-1971   49 y.o. Male  MRN: [id number] Visit Date: 06/11/2020  Today's healthcare provider: Kristian Covey, PA-C  Care Team Podiatry,Dr.Norman Regal Otolaryngology,Dr.Mitchell Gore Neurology,Dr.Vikram Penumalli Urology,Dr. Cecille Amsterdam ENT, [facility] Complaint  Patient presents with  . Annual Exam    with fasting labs   I, Porscha McClurkin acting as a Neurosurgeon for [employer], PA-C.,have documented all relevant documentation on the behalf of Kristian Covey, PA-C,as directed by  [employer], PA-C while in the presence of [employer], PA-C.  Subjective    Mark Arroyo is a 49 y.o. male who presents today for a complete physical exam.   The patient does not participate in regular exercise at present. Due to job requiring 12 hour shifts daily. He generally feels fairly well.  HPI HPI    Annual Exam     Additional comments: with fasting labs        Last edited by Victorio Palm, CMA on 06/11/2020  1:51 PM. (History)      Asthma Use inhaler once every 3 or 4 days. No prior preventative inhaler use  Dad had heart disease (MI)  Social drinker  Patient declined covid vaccine and states that wife want him to use a lot of natural products.  Past Medical History:  Diagnosis Date  . Asthma   . GERD (gastroesophageal reflux disease)    intermittent  . Migraines    as adult, age 30, usually stress related, no frequent headaches since 04/2014  . Seasonal allergic rhinitis   . Wears glasses    Past Surgical History:  Procedure Laterality Date  . ANTERIOR CERVICAL DECOMP/DISCECTOMY FUSION  2013   Dr. Yevette Edwards  . COLONOSCOPY  38   due to stomach issues, normal, [place]  . INGUINAL HERNIA REPAIR  2003   right  . MEDIAL COLLATERAL LIGAMENT REPAIR, KNEE  2011   left  . NASAL SINUS SURGERY  06/25/15   x2,   . SHOULDER SURGERY Left 08/2013   left, labrum repair, RTC repair,  spur removal; [place]  . VASECTOMY  2013    Family Status  Relation Name Status  . Mother  Alive  . Father  Alive  . Sister  Alive  . Brother  Alive  . Sister 104 Alive  . Sister  Alive  . Brother  Alive  . Brother 15 Alive  . Neg Hx  (Not Specified)   Family History  Problem Relation Age of Onset  . Diabetes Mother   . Heart disease Father 58       MI  . Prostatitis Father   . Kidney disease Sister   . Diabetes Brother   . Diabetes Sister   . Cancer Neg Hx    No Known Allergies  Patient Care Team: Ivey Nembhard, Kermit Balo, PA-C as PCP - General (Family Medicine)   Medications: Outpatient Medications Prior to Visit  Medication Sig  . cefdinir (OMNICEF) 300 MG capsule PLEASE SEE ATTACHED FOR DETAILED DIRECTIONS  . triamcinolone (NASACORT) 55 MCG/ACT AERO nasal inhaler 2 sprays 2 (two) times daily.  . [DISCONTINUED] albuterol (PROVENTIL HFA;VENTOLIN HFA) 108 (90 Base) MCG/ACT inhaler TAKE 2 PUFFS BY MOUTH EVERY 6 HOURS AS NEEDED FOR WHEEZE OR SHORTNESS OF BREATH  . diclofenac (VOLTAREN) 75 MG EC tablet Take 1 tablet (75 mg total) by mouth 2 (two) times daily. (Patient not taking: Reported on 05/15/2020)  . [DISCONTINUED] amoxicillin (AMOXIL)  875 MG tablet Take 1 tablet (875 mg total) by mouth 2 (two) times daily. (Patient not taking: Reported on 06/11/2020)  . [DISCONTINUED] ibuprofen (ADVIL,MOTRIN) 200 MG tablet Take 200 mg by mouth every 6 (six) hours as needed. Reported on 02/23/2016 (Patient not taking: Reported on 05/15/2020)   Facility-Administered Medications Prior to Visit  Medication Dose Route Frequency Provider  . gadopentetate dimeglumine (MAGNEVIST) injection 20 mL  20 mL Intravenous Once PRN Penumalli, Glenford Bayley, MD    Review of Systems Review of Systems Constitutional: -fever, -chills, -sweats, -unexpected weight change, -anorexia, -fatigue Allergy: -sneezing, -itching, -congestion Dermatology: denies changing moles, rash, lumps, new worrisome lesions Cardiology:   -chest pain, -palpitations, -edema, -orthopnea, -paroxysmal nocturnal dyspnea Respiratory: -cough, -dyspnea on exertion, -wheezing, -hemoptysis Gastroenterology: -abdominal pain, -nausea, -vomiting, -diarrhea, -constipation, -blood in stool, -changes in bowel movement, -dysphagia Hematology: -bleeding or bruising problems Musculoskeletal: -arthralgias, -myalgias, -joint swelling, -back pain, -neck pain, -cramping, -gait changes Ophthalmology: -vision changes, -eye redness, -itching, -discharge Urology: -dysuria, -difficulty urinating, -hematuria, -urinary frequency, -urgency, incontinence Neurology: -headache, -weakness, -tingling, -numbness, -speech abnormality, -memory loss, -falls, -dizziness Psychology:  -depressed mood, -agitation, -sleep problems     Objective    BP 108/80   Pulse 81   Ht 5\' 11"  (1.803 m)   Wt 231 lb 12.8 oz (105.1 kg)   SpO2 96%   BMI 32.33 kg/m    Physical Exam Constitutional:      Appearance: Normal appearance. He is obese.  HENT:     Head: Normocephalic.     Nose: Nose normal.     Mouth/Throat:     Mouth: Mucous membranes are dry.     Pharynx: Oropharynx is clear.  Eyes:     Extraocular Movements: Extraocular movements intact.     Conjunctiva/sclera: Conjunctivae normal.     Pupils: Pupils are equal, round, and reactive to light.  Neck:     Vascular: No carotid bruit.  Cardiovascular:     Rate and Rhythm: Normal rate and regular rhythm.     Pulses: Normal pulses.     Heart sounds: Normal heart sounds. No murmur heard.  No gallop.   Pulmonary:     Effort: Pulmonary effort is normal.     Breath sounds: Normal breath sounds. No wheezing, rhonchi or rales.  Abdominal:     General: Abdomen is flat. Bowel sounds are normal.     Palpations: Abdomen is soft.     Tenderness: There is no abdominal tenderness.  Genitourinary:    Comments: GU normal male, no mass no hernia no lymphadenopathy, anus normal tone, prostate seems mild to moderate enlarged  without nodules Musculoskeletal:     Cervical back: Normal range of motion and neck supple.     Right lower leg: No edema.     Left lower leg: No edema.  Skin:    General: Skin is warm and dry.  Neurological:     General: No focal deficit present.     Mental Status: He is alert and oriented to person, place, and time.  Psychiatric:        Mood and Affect: Mood normal.        Behavior: Behavior normal.       Last depression screening scores PHQ 2/9 Scores 06/11/2020 02/27/2018 02/23/2017  PHQ - 2 Score 0 0 0   Last fall risk screening Fall Risk  02/27/2018  Falls in the past year? No      Assessment & Plan   Encounter Diagnoses  Name Primary?  03/01/2018  Encounter for health maintenance examination in adult Yes  . Family history of premature CAD   . Screening for prostate cancer   . Impaired fasting blood sugar   . Mild intermittent asthma without complication   . Allergic rhinitis due to pollen, unspecified seasonality   . Irritable bowel syndrome, unspecified type   . Influenza vaccine refused       Patient was advised the importance of the Covid vaccine and the risk to exposing elderly family members to covid if not vaccinated.  Patient was advised he have options of doing CT scan or nothing test of the heart due to father having heart disease.  Patient was advised if he is using a rescue inhaler daily then he may need to be on a preventive inhaler. Also they have a longer acting symptom control than albuterol inhaler.  Routine Health Maintenance and Physical Exam  Exercise Activities and Dietary recommendations Goals   None     Immunization History  Administered Date(s) Administered  . Pneumococcal Polysaccharide-23 12/12/2013, 01/15/2015  . Td 02/04/2013  . Tdap 10/26/2011    Health Maintenance  Topic Date Due  . Hepatitis C Screening  Never done  . COVID-19 Vaccine (1) Never done  . HIV Screening  Never done  . INFLUENZA VACCINE  Never done  . TETANUS/TDAP   02/05/2023    Physical exam - discussed and counseled on healthy lifestyle, diet, exercise, preventative care, vaccinations, sick and well care, proper use of emergency dept and after hours care, and addressed their concerns.    Health screening: See your eye doctor yearly for routine vision care. See your dentist yearly for routine dental care including hygiene visits twice yearly.  Cancer screening Colonoscopy:  Advised to check insurance coverage for updated screening colonoscopy since he is over 45  Discussed PSA, prostate exam, and prostate cancer screening risks/benefits.     Vaccinations: He is up-to-date on tetanus vaccine and pneumonia vaccine He declines Covid and flu vaccine   Acute issues discussed: Treating for sinusitis per other doctors office currently, ENT in [place]  Separate significant chronic issues discussed: Asthma-advise he begin trial of sample preventative inhaler. Continue albuterol as needed. Discussed demonstrating prevention and rescue inhaler. Follow-up after use of sample  Premature family history of CAD-discussed the significance of this risk factor and potential screening with either stress test or calcium score. He will let me know if he wants to move forward with screening  Impaired glucose-labs today  Jefferey was seen today for annual exam.  Diagnoses and all orders for this visit:  Encounter for health maintenance examination in adult -     Lipid panel -     Comprehensive metabolic panel -     CBC with Differential/Platelet -     PSA -     Hemoglobin A1c  Family history of premature CAD  Screening for prostate cancer -     PSA  Impaired fasting blood sugar -     Hemoglobin A1c  Mild intermittent asthma without complication  Allergic rhinitis due to pollen, unspecified seasonality  Irritable bowel syndrome, unspecified type  Influenza vaccine refused  Other orders -     albuterol (VENTOLIN HFA) 108 (90 Base) MCG/ACT  inhaler; TAKE 2 PUFFS BY MOUTH EVERY 6 HOURS AS NEEDED FOR WHEEZE OR SHORTNESS OF BREATH -     Mometasone Furoate (ASMANEX, 30 METERED DOSES,) 110 MCG/INH AEPB; Inhale 1 Inhaler into the lungs daily.    Follow-up pending labs, yearly for physical  Dorothea Ogle, PA-C  [place] [phone] (phone) [phone] (fax)  [place]

## 2020-06-12 ENCOUNTER — Other Ambulatory Visit: Payer: Self-pay | Admitting: Medical

## 2020-06-12 LAB — COMPREHENSIVE METABOLIC PANEL
ALT: 13 IU/L (ref 0–44)
AST: 9 IU/L (ref 0–40)
Albumin/Globulin Ratio: 1.6 (ref 1.2–2.2)
Albumin: 4.3 g/dL (ref 4.0–5.0)
Alkaline Phosphatase: 76 IU/L (ref 44–121)
BUN/Creatinine Ratio: 12 (ref 9–20)
BUN: 11 mg/dL (ref 6–24)
Bilirubin Total: 0.8 mg/dL (ref 0.0–1.2)
CO2: 24 mmol/L (ref 20–29)
Calcium: 9.3 mg/dL (ref 8.7–10.2)
Chloride: 100 mmol/L (ref 96–106)
Creatinine, Ser: 0.94 mg/dL (ref 0.76–1.27)
GFR calc Af Amer: 110 mL/min/{1.73_m2} (ref >59)
GFR calc non Af Amer: 95 mL/min/{1.73_m2} (ref >59)
Globulin, Total: 2.7 g/dL (ref 1.5–4.5)
Glucose: 94 mg/dL (ref 65–99)
Potassium: 3.8 mmol/L (ref 3.5–5.2)
Sodium: 139 mmol/L (ref 134–144)
Total Protein: 7 g/dL (ref 6.0–8.5)

## 2020-06-12 LAB — CBC WITH DIFFERENTIAL/PLATELET
Basophils Absolute: 0 10*3/uL (ref 0.0–0.2)
Basos: 0 %
EOS (ABSOLUTE): 0.2 10*3/uL (ref 0.0–0.4)
Eos: 3 %
Hematocrit: 46.4 % (ref 37.5–51.0)
Hemoglobin: 14.6 g/dL (ref 13.0–17.7)
Immature Grans (Abs): 0.1 10*3/uL (ref 0.0–0.1)
Immature Granulocytes: 1 %
Lymphocytes Absolute: 3.1 10*3/uL (ref 0.7–3.1)
Lymphs: 36 %
MCH: 25.8 pg — ABNORMAL LOW (ref 26.6–33.0)
MCHC: 31.5 g/dL (ref 31.5–35.7)
MCV: 82 fL (ref 79–97)
Monocytes Absolute: 0.4 10*3/uL (ref 0.1–0.9)
Monocytes: 5 %
Neutrophils Absolute: 4.8 10*3/uL (ref 1.4–7.0)
Neutrophils: 55 %
Platelets: 244 10*3/uL (ref 150–450)
RBC: 5.66 x10E6/uL (ref 4.14–5.80)
RDW: 13.6 % (ref 11.6–15.4)
WBC: 8.7 10*3/uL (ref 3.4–10.8)

## 2020-06-12 LAB — PSA: Prostate Specific Ag, Serum: 0.7 ng/mL (ref 0.0–4.0)

## 2020-06-12 LAB — LIPID PANEL
Chol/HDL Ratio: 2.6 ratio (ref 0.0–5.0)
Cholesterol, Total: 160 mg/dL (ref 100–199)
HDL: 61 mg/dL (ref >39)
LDL Chol Calc (NIH): 76 mg/dL (ref 0–99)
Triglycerides: 132 mg/dL (ref 0–149)
VLDL Cholesterol Cal: 23 mg/dL (ref 5–40)

## 2020-06-12 LAB — HEMOGLOBIN A1C
Est. average glucose Bld gHb Est-mCnc: 137 mg/dL
Hgb A1c MFr Bld: 6.4 % — ABNORMAL HIGH (ref 4.8–5.6)

## 2020-06-12 MED ORDER — METFORMIN HCL 500 MG PO TABS: 500.0000 mg | ORAL_TABLET | Freq: Every day | ORAL | 3 refills | Status: DC

## 2020-06-24 ENCOUNTER — Other Ambulatory Visit: Payer: Self-pay | Admitting: Medical

## 2020-06-24 ENCOUNTER — Telehealth: Payer: Self-pay | Admitting: Medical

## 2020-06-24 MED ORDER — ASMANEX (30 METERED DOSES) 110 MCG/INH IN AEPB: 1.0000 | INHALATION_SPRAY | Freq: Every day | RESPIRATORY_TRACT | 3 refills | Status: DC

## 2020-06-24 NOTE — Telephone Encounter (Signed)
Pt called and states that the sample of Asmanex is working. He states he would ike it called into his pharmacy which is [facility] [address] in [place]. Pt can be reached at [phone].

## 2020-06-24 NOTE — Telephone Encounter (Signed)
Rx sent to pharmacy   

## 2020-06-25 NOTE — Telephone Encounter (Signed)
I sent Asmanex to pharmacy as requested.  I get this request today.  Was Asmanex not covered or too expensive?

## 2020-06-27 NOTE — Telephone Encounter (Signed)
Called pharmacy & Asmanex is non formulary with insurance and pt needs to try Flovent Diskus or Arnuity,  Can he be switched?

## 2020-07-07 ENCOUNTER — Telehealth: Payer: Self-pay

## 2020-07-07 NOTE — Telephone Encounter (Signed)
Pt switched to Flovent

## 2020-08-22 ENCOUNTER — Other Ambulatory Visit: Payer: Self-pay | Admitting: Medical

## 2020-10-11 ENCOUNTER — Other Ambulatory Visit: Payer: Self-pay | Admitting: Medical

## 2020-11-24 ENCOUNTER — Telehealth: Payer: Self-pay

## 2020-11-24 ENCOUNTER — Other Ambulatory Visit: Payer: Self-pay | Admitting: Medical

## 2020-11-24 MED ORDER — LEVALBUTEROL TARTRATE 45 MCG/ACT IN AERO: 1.0000 | INHALATION_SPRAY | RESPIRATORY_TRACT | 2 refills | Status: DC | PRN

## 2020-11-24 NOTE — Telephone Encounter (Signed)
Called [employer] t# [phone] to find out covered alternative and was advised Levalbuterol (Xopenex) covered but generic Albuterol is also covered. So please sent in refills same albuterol inhaler

## 2020-11-24 NOTE — Telephone Encounter (Signed)
I sent Xopenex to replace Albuterol rescue inhaler if this is cheaper than albuterol

## 2020-11-24 NOTE — Telephone Encounter (Signed)
Pt got a call that Albuterol inhaler would no longer be covered need to switch but didn't say what preferred alternative was.  He just had it filled a week ago Vent[facility] so too soon for refill.  Will call in about 2 weeks and see what will be covered.  Has tried Proair in past but didn't like as well but ok with switching to whatever insurance will pay for.  Has not tried Proventil.

## 2020-12-05 MED ORDER — ALBUTEROL SULFATE HFA 108 (90 BASE) MCG/ACT IN AERS: 2.0000 | INHALATION_SPRAY | Freq: Four times a day (QID) | RESPIRATORY_TRACT | 0 refills | Status: DC | PRN

## 2020-12-05 NOTE — Telephone Encounter (Signed)
Called pharmacy and Xopenex is actually more expensive $48.  Generic Proair covered and is cheapest $31, so went with that one.

## 2020-12-06 NOTE — Telephone Encounter (Signed)
done

## 2021-01-08 ENCOUNTER — Other Ambulatory Visit: Payer: Self-pay | Admitting: Medical

## 2021-01-08 NOTE — Telephone Encounter (Signed)
Called patient to see if this was an auto refill, he does need this refilled.

## 2021-04-16 ENCOUNTER — Other Ambulatory Visit: Payer: Self-pay | Admitting: Medical

## 2021-04-16 NOTE — Telephone Encounter (Signed)
Is this okay to refill? Called patient and he did request.

## 2021-06-02 DIAGNOSIS — G4733 Obstructive sleep apnea (adult) (pediatric): Secondary | ICD-10-CM | POA: Diagnosis not present

## 2021-06-02 DIAGNOSIS — M5030 Other cervical disc degeneration, unspecified cervical region: Secondary | ICD-10-CM | POA: Diagnosis not present

## 2021-06-02 DIAGNOSIS — Z87820 Personal history of traumatic brain injury: Secondary | ICD-10-CM | POA: Diagnosis not present

## 2021-06-02 DIAGNOSIS — G4486 Cervicogenic headache: Secondary | ICD-10-CM | POA: Diagnosis not present

## 2021-06-02 DIAGNOSIS — E559 Vitamin D deficiency, unspecified: Secondary | ICD-10-CM | POA: Diagnosis not present

## 2021-06-03 ENCOUNTER — Other Ambulatory Visit: Payer: Self-pay | Admitting: Neurology

## 2021-06-03 DIAGNOSIS — R519 Headache, unspecified: Secondary | ICD-10-CM

## 2021-06-10 ENCOUNTER — Ambulatory Visit: Payer: Managed Care, Other (non HMO)

## 2021-06-12 ENCOUNTER — Encounter: Payer: No Typology Code available for payment source | Admitting: Family Medicine

## 2021-06-12 ENCOUNTER — Other Ambulatory Visit: Payer: Self-pay | Admitting: Medical

## 2021-06-17 ENCOUNTER — Other Ambulatory Visit: Payer: Self-pay

## 2021-06-17 ENCOUNTER — Ambulatory Visit
Admission: RE | Admit: 2021-06-17 | Discharge: 2021-06-17 | Disposition: A | Discharge status: Home / Self Care | Payer: Federal, State, Local not specified - PPO | Source: Ambulatory Visit | Attending: Neurology | Admitting: Neurology

## 2021-06-17 DIAGNOSIS — G9389 Other specified disorders of brain: Secondary | ICD-10-CM | POA: Diagnosis not present

## 2021-06-17 DIAGNOSIS — R519 Headache, unspecified: Secondary | ICD-10-CM | POA: Diagnosis not present

## 2021-06-17 MED ORDER — GADOBUTROL 1 MMOL/ML IV SOLN
10.0000 mL | Freq: Once | INTRAVENOUS | Status: AC | PRN
  Administered 2021-06-17: 10 mL via INTRAVENOUS

## 2021-06-30 DIAGNOSIS — G4733 Obstructive sleep apnea (adult) (pediatric): Secondary | ICD-10-CM | POA: Diagnosis not present

## 2021-06-30 DIAGNOSIS — E559 Vitamin D deficiency, unspecified: Secondary | ICD-10-CM | POA: Diagnosis not present

## 2021-06-30 DIAGNOSIS — M5030 Other cervical disc degeneration, unspecified cervical region: Secondary | ICD-10-CM | POA: Diagnosis not present

## 2021-06-30 DIAGNOSIS — G4486 Cervicogenic headache: Secondary | ICD-10-CM | POA: Diagnosis not present

## 2021-07-03 DIAGNOSIS — G4733 Obstructive sleep apnea (adult) (pediatric): Secondary | ICD-10-CM | POA: Diagnosis not present

## 2021-07-08 ENCOUNTER — Other Ambulatory Visit: Payer: Self-pay | Admitting: Medical

## 2021-07-08 ENCOUNTER — Other Ambulatory Visit: Payer: Self-pay

## 2021-07-08 ENCOUNTER — Ambulatory Visit (INDEPENDENT_AMBULATORY_CARE_PROVIDER_SITE_OTHER): Payer: Federal, State, Local not specified - PPO | Admitting: Medical

## 2021-07-08 ENCOUNTER — Encounter: Payer: Self-pay | Admitting: Medical

## 2021-07-08 DIAGNOSIS — R739 Hyperglycemia, unspecified: Secondary | ICD-10-CM | POA: Diagnosis not present

## 2021-07-08 DIAGNOSIS — Z8249 Family history of ischemic heart disease and other diseases of the circulatory system: Secondary | ICD-10-CM | POA: Diagnosis not present

## 2021-07-08 DIAGNOSIS — Z6833 Body mass index (BMI) 33.0-33.9, adult: Secondary | ICD-10-CM

## 2021-07-08 DIAGNOSIS — Z136 Encounter for screening for cardiovascular disorders: Secondary | ICD-10-CM | POA: Diagnosis not present

## 2021-07-08 DIAGNOSIS — Z23 Encounter for immunization: Secondary | ICD-10-CM | POA: Diagnosis not present

## 2021-07-08 DIAGNOSIS — G43909 Migraine, unspecified, not intractable, without status migrainosus: Secondary | ICD-10-CM | POA: Insufficient documentation

## 2021-07-08 DIAGNOSIS — K589 Irritable bowel syndrome without diarrhea: Secondary | ICD-10-CM

## 2021-07-08 DIAGNOSIS — R7301 Impaired fasting glucose: Secondary | ICD-10-CM | POA: Diagnosis not present

## 2021-07-08 DIAGNOSIS — Z0000 Encounter for general adult medical examination without abnormal findings: Secondary | ICD-10-CM

## 2021-07-08 DIAGNOSIS — J301 Allergic rhinitis due to pollen: Secondary | ICD-10-CM

## 2021-07-08 DIAGNOSIS — G43809 Other migraine, not intractable, without status migrainosus: Secondary | ICD-10-CM

## 2021-07-08 DIAGNOSIS — J4520 Mild intermittent asthma, uncomplicated: Secondary | ICD-10-CM

## 2021-07-08 DIAGNOSIS — Z125 Encounter for screening for malignant neoplasm of prostate: Secondary | ICD-10-CM | POA: Diagnosis not present

## 2021-07-08 NOTE — Progress Notes (Signed)
Subjective:   HPI  Mark Arroyo is a 50 y.o. male who presents for Chief Complaint  Patient presents with   fasting cpe    Fasting cpe, declines flu shot, no concerns    Patient Care Team: Sheilia Reznick, Cleda Mccreedy as PCP - General (Family Medicine) Podiatry,Dr.Norman Regal, prior Dr. Cristopher Peru, neurology in [facility] Urology,Dr. Cecille Amsterdam, prior ENT, Dr. Wardell Heath, [facility] YYT:KPTWS Derryck Shahan, PA-C    Concerns: History of migraines, seeing neurology.  Awaiting to get on Aimovig.  Just has some labs done through neurology  He notes having colonoscopy age 83yo, they advised repeat age 48yo  Asthma - some problems in last year since working back in the office, and has gained some weight which has aggravated the asthma.  Uses albuterol prn, not too regularly, not weekly.  Reviewed their medical, surgical, family, social, medication, and allergy history and updated chart as appropriate.  Past Medical History:  Diagnosis Date   Asthma    GERD (gastroesophageal reflux disease)    intermittent   Migraines    as adult, age 56, usually stress related, no frequent headaches since 04/2014   Seasonal allergic rhinitis    Wears glasses     Past Surgical History:  Procedure Laterality Date   ANTERIOR CERVICAL DECOMP/DISCECTOMY FUSION  09/07/2011   Dr. Yevette Edwards   COLONOSCOPY  43   due to stomach issues, normal, [place]   INGUINAL HERNIA REPAIR  09/06/2001   right   MEDIAL COLLATERAL LIGAMENT REPAIR, KNEE  09/06/2009   left   NASAL SINUS SURGERY  06/25/2015   x2,    SHOULDER SURGERY Left 08/06/2013   left, labrum repair, RTC repair, spur removal; [place]   VASECTOMY  09/07/2011    Family History  Problem Relation Age of Onset   Diabetes Mother    Heart disease Father 5       MI   Prostatitis Father    Cancer Sister    Kidney disease Sister    Diabetes Sister    Diabetes Brother      Current Outpatient Medications:    albuterol (VENTOLIN  HFA) 108 (90 Base) MCG/ACT inhaler, INHALE 2 PUFFS BY MOUTH EVERY 6 HOURS AS NEEDED FOR WHEEZING OR SHORTNESS OF BREATH, Disp: 8.5 each, Rfl: 0   fluticasone (FLOVENT DISKUS) 50 MCG/BLIST diskus inhaler, Inhale 1 puff into the lungs 2 (two) times daily., Disp: 1 each, Rfl: 2   Azelastine HCl 137 MCG/SPRAY SOLN, Place 2 sprays into both nostrils 2 (two) times daily., Disp: , Rfl:    Vitamin D, Ergocalciferol, (DRISDOL) 1.25 MG (50000 UNIT) CAPS capsule, Take 50,000 Units by mouth once a week., Disp: , Rfl:  No current facility-administered medications for this visit.  Facility-Administered Medications Ordered in Other Visits:    gadopentetate dimeglumine (MAGNEVIST) injection 20 mL, 20 mL, Intravenous, Once PRN, Penumalli, Vikram R, MD  No Known Allergies   Review of Systems Constitutional: -fever, -chills, -sweats, -unexpected weight change, -decreased appetite, -fatigue Allergy: -sneezing, -itching, -congestion Dermatology: -changing moles, --rash, -lumps ENT: -runny nose, -ear pain, -sore throat, -hoarseness, -sinus pain, -teeth pain, - ringing in ears, -hearing loss, -nosebleeds Cardiology: -chest pain, -palpitations, -swelling, -difficulty breathing when lying flat, -waking up short of breath Respiratory: -cough, -shortness of breath, -difficulty breathing with exercise or exertion, -wheezing, -coughing up blood Gastroenterology: -abdominal pain, -nausea, -vomiting, -diarrhea, -constipation, -blood in stool, -changes in bowel movement, -difficulty swallowing or eating Hematology: -bleeding, -bruising  Musculoskeletal: -joint aches, -muscle aches, -joint swelling, -back pain, -  neck pain, -cramping, -changes in gait Ophthalmology: denies vision changes, eye redness, itching, discharge Urology: -burning with urination, -difficulty urinating, -blood in urine, -urinary frequency, -urgency, -incontinence Neurology: -headache, -weakness, -tingling, -numbness, -memory loss, -falls,  -dizziness Psychology: -depressed mood, -agitation, -sleep problems Male GU: no testicular mass, pain, no lymph nodes swollen, no swelling, no rash.    Depression screen [facility] 2/9 07/08/2021 06/11/2020 02/27/2018 02/23/2017  Decreased Interest 0 0 0 0  Down, Depressed, Hopeless 0 0 0 0  PHQ - 2 Score 0 0 0 0        Objective:  BP 110/70   Pulse 76   Ht 5' 10.5" (1.791 m)   Wt 239 lb (108.4 kg)   BMI 33.81 kg/m   General appearance: alert, no distress, WD/WN, African American male Skin: unremarkable HEENT: normocephalic, conjunctiva/corneas normal, sclerae anicteric, PERRLA, EOMi, nares patent, no discharge or erythema, pharynx normal Oral cavity: MMM, tongue normal, teeth in good repair Neck: supple, no lymphadenopathy, no thyromegaly, no masses, normal ROM, no bruits Chest: non tender, normal shape and expansion Heart: RRR, normal S1, S2, no murmurs Lungs: CTA bilaterally, no wheezes, rhonchi, or rales Abdomen: +bs, soft, non tender, non distended, no masses, no hepatomegaly, no splenomegaly, no bruits Back: non tender, normal ROM, no scoliosis Musculoskeletal: limited exam, non tender, no obvious deformity, normal ROM throughout Extremities: no edema, no cyanosis, no clubbing Pulses: 2+ symmetric, upper and lower extremities, normal cap refill Neurological: alert, oriented x 3, CN2-12 intact, strength normal upper extremities and lower extremities, sensation normal throughout, DTRs 2+ throughout, no cerebellar signs, gait normal Psychiatric: normal affect, behavior normal, pleasant  GU: normal male external genitalia,circumcised, nontender, no masses, no hernia, no lymphadenopathy Rectal: Anus normal tone, prostate within normal limits   EKG indication physical and screening for heart disease, rate 75 bpm, PR 126 ms, QRS 78 ms, QTC 451 ms, axis 71 degrees, normal sinus rhythm, some artifact on baseline today  Assessment and Plan :   Encounter Diagnoses  Name Primary?    Encounter for health maintenance examination in adult Yes   Family history of premature CAD    Impaired fasting blood sugar    Screening for prostate cancer    Allergic rhinitis due to pollen, unspecified seasonality    Mild intermittent asthma without complication    Irritable bowel syndrome, unspecified type    Hyperglycemia    Need for pneumococcal vaccination    BMI 33.0-33.9,adult    Other migraine without status migrainosus, not intractable     This visit was a preventative care visit, also known as wellness visit or routine physical.   Topics typically include healthy lifestyle, diet, exercise, preventative care, vaccinations, sick and well care, proper use of emergency dept and after hours care, as well as other concerns.     Recommendations: Continue to return yearly for your annual wellness and preventative care visits.  This gives [place] a chance to discuss healthy lifestyle, exercise, vaccinations, review your chart record, and perform screenings where appropriate.  I recommend you see your eye doctor yearly for routine vision care.  I recommend you see your dentist yearly for routine dental care including hygiene visits twice yearly.   Vaccination recommendations were reviewed Immunization History  Administered Date(s) Administered   Pneumococcal Polysaccharide-23 12/12/2013, 01/15/2015   Td 02/04/2013   Tdap 10/26/2011    He declines flu shot  Counseled on the pneumococcal vaccine.  Vaccine information sheet given.  Pneumococcal vaccine Prevnar 13 given after consent obtained.  Screening for cancer: Colon cancer screening: Plan repeat next year age 74  We discussed PSA, prostate exam, and prostate cancer screening risks/benefits.     Skin cancer screening: Check your skin regularly for new changes, growing lesions, or other lesions of concern Come in for evaluation if you have skin lesions of concern.  Lung cancer screening: If you have a greater than 20  pack year history of tobacco use, then you may qualify for lung cancer screening with a chest CT scan.   Please call your insurance company to inquire about coverage for this test.  We currently don't have screenings for other cancers besides breast, cervical, colon, and lung cancers.  If you have a strong family history of cancer or have other cancer screening concerns, please let me know.    Bone health: Get at least 150 minutes of aerobic exercise weekly Get weight bearing exercise at least once weekly Bone density test:  A bone density test is an imaging test that uses a type of X-ray to measure the amount of calcium and other minerals in your bones. The test may be used to diagnose or screen you for a condition that causes weak or thin bones (osteoporosis), predict your risk for a broken bone (fracture), or determine how well your osteoporosis treatment is working. The bone density test is recommended for females 65 and older, or females or males <65 if certain risk factors such as thyroid disease, long term use of steroids such as for asthma or rheumatological issues, vitamin D deficiency, estrogen deficiency, family history of osteoporosis, self or family history of fragility fracture in first degree relative.    Heart health: Get at least 150 minutes of aerobic exercise weekly Limit alcohol It is important to maintain a healthy blood pressure and healthy cholesterol numbers  Heart disease screening: Screening for heart disease includes screening for blood pressure, fasting lipids, glucose/diabetes screening, BMI height to weight ratio, reviewed of smoking status, physical activity, and diet.    Goals include blood pressure 120/80 or less, maintaining a healthy lipid/cholesterol profile, preventing diabetes or keeping diabetes numbers under good control, not smoking or using tobacco products, exercising most days per week or at least 150 minutes per week of exercise, and eating healthy  variety of fruits and vegetables, healthy oils, and avoiding unhealthy food choices like fried food, fast food, high sugar and high cholesterol foods.    Other tests may possibly include EKG test, CT coronary calcium score, echocardiogram, exercise treadmill stress test.   Consider coronary CT test given age and premature CAD in father   Medical care options: I recommend you continue to seek care here first for routine care.  We try really hard to have available appointments Monday through Friday daytime hours for sick visits, acute visits, and physicals.  Urgent care should be used for after hours and weekends for significant issues that cannot wait till the next day.  The emergency department should be used for significant potentially life-threatening emergencies.  The emergency department is expensive, can often have long wait times for less significant concerns, so try to utilize primary care, urgent care, or telemedicine when possible to avoid unnecessary trips to the emergency department.  Virtual visits and telemedicine have been introduced since the pandemic started in 2020, and can be convenient ways to receive medical care.  We offer virtual appointments as well to assist you in a variety of options to seek medical care.    Separate significant issues discussed: Impaired  glucose - updated fasting labs today  Asthma - PFT reviewed.  Mild airway obstruction  Obesity - work on losing weight through health diet and exercise  Migraines - seeing neurology, starting Aimiovig soon   Mark Arroyo was seen today for fasting cpe.  Diagnoses and all orders for this visit:  Encounter for health maintenance examination in adult -     PSA -     Lipid panel -     Hemoglobin A1c -     Glucose, Random  Family history of premature CAD -     Lipid panel  Impaired fasting blood sugar -     Hemoglobin A1c -     Glucose, Random  Screening for prostate cancer -     PSA  Allergic rhinitis due  to pollen, unspecified seasonality  Mild intermittent asthma without complication -     Spirometry with Graph  Irritable bowel syndrome, unspecified type  Hyperglycemia -     Glucose, Random  Need for pneumococcal vaccination  BMI 33.0-33.9,adult  Other migraine without status migrainosus, not intractable  Follow-up pending labs, yearly for physical

## 2021-07-08 NOTE — Telephone Encounter (Signed)
Pt has an appt today with shane. Will refill at appt

## 2021-07-09 LAB — HEMOGLOBIN A1C
Est. average glucose Bld gHb Est-mCnc: 148 mg/dL
Hgb A1c MFr Bld: 6.8 % — ABNORMAL HIGH (ref 4.8–5.6)

## 2021-07-09 LAB — GLUCOSE, RANDOM: Glucose: 111 mg/dL — ABNORMAL HIGH (ref 70–99)

## 2021-07-09 LAB — LIPID PANEL
Chol/HDL Ratio: 3.9 ratio (ref 0.0–5.0)
Cholesterol, Total: 211 mg/dL — ABNORMAL HIGH (ref 100–199)
HDL: 54 mg/dL (ref >39)
LDL Chol Calc (NIH): 139 mg/dL — ABNORMAL HIGH (ref 0–99)
Triglycerides: 101 mg/dL (ref 0–149)
VLDL Cholesterol Cal: 18 mg/dL (ref 5–40)

## 2021-07-09 LAB — PSA: Prostate Specific Ag, Serum: 0.9 ng/mL (ref 0.0–4.0)

## 2021-07-10 ENCOUNTER — Encounter: Payer: Self-pay | Admitting: Medical

## 2021-07-10 NOTE — Addendum Note (Signed)
Addended by: Jac Canavan on: 07/10/2021 10:27 AM   Modules accepted: Orders

## 2021-07-14 ENCOUNTER — Encounter: Payer: Self-pay | Admitting: Internal Medicine

## 2021-07-16 ENCOUNTER — Other Ambulatory Visit: Payer: Self-pay | Admitting: Medical

## 2021-07-16 MED ORDER — METFORMIN HCL ER 500 MG PO TB24: 500.0000 mg | ORAL_TABLET | Freq: Every day | ORAL | 0 refills | Status: DC

## 2021-07-16 MED ORDER — ROSUVASTATIN CALCIUM 10 MG PO TABS: 10.0000 mg | ORAL_TABLET | Freq: Every day | ORAL | 3 refills | Status: DC

## 2021-08-03 DIAGNOSIS — G4733 Obstructive sleep apnea (adult) (pediatric): Secondary | ICD-10-CM | POA: Diagnosis not present

## 2021-08-05 DIAGNOSIS — G4733 Obstructive sleep apnea (adult) (pediatric): Secondary | ICD-10-CM | POA: Diagnosis not present

## 2021-08-11 ENCOUNTER — Other Ambulatory Visit: Payer: Self-pay | Admitting: Medical

## 2021-09-04 DIAGNOSIS — G4733 Obstructive sleep apnea (adult) (pediatric): Secondary | ICD-10-CM | POA: Diagnosis not present

## 2021-09-07 ENCOUNTER — Other Ambulatory Visit: Payer: Self-pay | Admitting: Medical

## 2021-10-01 ENCOUNTER — Telehealth: Payer: Self-pay

## 2021-10-01 NOTE — Telephone Encounter (Signed)
Pt. Was told to call back and let you know when his colonoscopy was. He said he has been over 10 years but didn't have an exact date. He said [facility] [place]. He wanted to know if you could put in a referral for a colonoscopy to a GI here in [place].

## 2021-10-02 ENCOUNTER — Other Ambulatory Visit: Payer: Self-pay | Admitting: Medical

## 2021-10-02 DIAGNOSIS — Z1211 Encounter for screening for malignant neoplasm of colon: Secondary | ICD-10-CM

## 2021-10-05 DIAGNOSIS — G4733 Obstructive sleep apnea (adult) (pediatric): Secondary | ICD-10-CM | POA: Diagnosis not present

## 2021-10-07 ENCOUNTER — Other Ambulatory Visit: Payer: Self-pay | Admitting: Medical

## 2021-10-20 ENCOUNTER — Ambulatory Visit (INDEPENDENT_AMBULATORY_CARE_PROVIDER_SITE_OTHER): Payer: Federal, State, Local not specified - PPO | Admitting: Medical

## 2021-10-20 ENCOUNTER — Other Ambulatory Visit: Payer: Self-pay

## 2021-10-20 VITALS — BP 110/70 | HR 88 | Wt 246.0 lb

## 2021-10-20 DIAGNOSIS — F4321 Adjustment disorder with depressed mood: Secondary | ICD-10-CM | POA: Diagnosis not present

## 2021-10-20 DIAGNOSIS — R739 Hyperglycemia, unspecified: Secondary | ICD-10-CM

## 2021-10-20 DIAGNOSIS — R7301 Impaired fasting glucose: Secondary | ICD-10-CM | POA: Diagnosis not present

## 2021-10-20 DIAGNOSIS — E785 Hyperlipidemia, unspecified: Secondary | ICD-10-CM | POA: Diagnosis not present

## 2021-10-20 NOTE — Progress Notes (Signed)
Subjective:  Mark Arroyo is a 51 y.o. male who presents for Chief Complaint  Patient presents with   fasting follow-up on meds    Fasitng follow-up on med. No concerns     Here for med check, follow-up from his November 2022 physical visit.  Last visit his blood sugars were elevated putting him at risk for diabetes.  I had recommended he start metformin to help control blood sugars.  He is compliant with metformin XR 500 mg daily.  Last visit hemoglobin A1c was 6.8%, glucose was 111 fasting  Hyperlipidemia - last visit I recommended we initiate statin.  He is taking Crestor daily without complaint  Vitamin D deficiency-compliant with 50,000 units weekly dose.  Sister passed on Christmas eve, so things have been rough.  Hasn't been eating the best, stress eat.  Sister passed of natural causes.  However, she had diabetes, hx/o cancer, just had completed surgery to remove cancer, but died in her sleep.    No other aggravating or relieving factors.    No other c/o.  Past Medical History:  Diagnosis Date   Asthma    GERD (gastroesophageal reflux disease)    intermittent   Migraines    as adult, age 6, usually stress related, no frequent headaches since 04/2014   Seasonal allergic rhinitis    Wears glasses    Current Outpatient Medications on File Prior to Visit  Medication Sig Dispense Refill   albuterol (VENTOLIN HFA) 108 (90 Base) MCG/ACT inhaler INHALE 2 PUFFS BY MOUTH EVERY 6 HOURS AS NEEDED FOR WHEEZING OR SHORTNESS OF BREATH 18 each 0   Azelastine HCl 137 MCG/SPRAY SOLN Place 2 sprays into both nostrils 2 (two) times daily.     fluticasone (FLOVENT DISKUS) 50 MCG/BLIST diskus inhaler Inhale 1 puff into the lungs 2 (two) times daily. 1 each 2   metFORMIN (GLUCOPHAGE XR) 500 MG 24 hr tablet Take 1 tablet (500 mg total) by mouth daily with breakfast. 90 tablet 0   rosuvastatin (CRESTOR) 10 MG tablet Take 1 tablet (10 mg total) by mouth daily. 90 tablet 3   Vitamin D,  Ergocalciferol, (DRISDOL) 1.25 MG (50000 UNIT) CAPS capsule Take 50,000 Units by mouth once a week.     Current Facility-Administered Medications on File Prior to Visit  Medication Dose Route Frequency Provider Last Rate Last Admin   gadopentetate dimeglumine (MAGNEVIST) injection 20 mL  20 mL Intravenous Once PRN Penumalli, Glenford Bayley, MD         The following portions of the patient's history were reviewed and updated as appropriate: allergies, current medications, past family history, past medical history, past social history, past surgical history and problem list.  ROS Otherwise as in subjective above    Objective: BP 110/70    Pulse 88    Wt 246 lb (111.6 kg)    BMI 34.80 kg/m   Wt Readings from Last 3 Encounters:  10/20/21 246 lb (111.6 kg)  07/08/21 239 lb (108.4 kg)  06/11/20 231 lb 12.8 oz (105.1 kg)   General appearance: alert, no distress, well developed, well nourished Otherwise not examined   Assessment: Encounter Diagnoses  Name Primary?   Impaired fasting blood sugar Yes   Hyperglycemia    Hyperlipidemia, unspecified hyperlipidemia type    Grieving      Plan: I expressed sympathy for the recent loss of his sister  Impaired glucose, hyperglycemia-he has begun metformin.  We counseled on healthier diet and exercise and plan to recheck in 2  months fasting for labs with nurse visit  Hyperlipidemia-he has begun statin.  Same plan as impaired glucose.    [name] was seen today for fasting follow-up on meds.  Diagnoses and all orders for this visit:  Impaired fasting blood sugar -     Hemoglobin A1c; Future -     Comprehensive metabolic panel; Future  Hyperglycemia -     Hemoglobin A1c; Future -     Comprehensive metabolic panel; Future  Hyperlipidemia, unspecified hyperlipidemia type -     Comprehensive metabolic panel; Future -     Lipid panel; Future  Grieving    Follow up: 2 mo for fasting labs, nurse visit

## 2021-10-21 DIAGNOSIS — R079 Chest pain, unspecified: Secondary | ICD-10-CM | POA: Diagnosis not present

## 2021-10-21 DIAGNOSIS — Z1211 Encounter for screening for malignant neoplasm of colon: Secondary | ICD-10-CM | POA: Diagnosis not present

## 2021-10-21 DIAGNOSIS — K5730 Diverticulosis of large intestine without perforation or abscess without bleeding: Secondary | ICD-10-CM | POA: Diagnosis not present

## 2021-10-21 DIAGNOSIS — J45998 Other asthma: Secondary | ICD-10-CM | POA: Diagnosis not present

## 2021-11-03 DIAGNOSIS — G4733 Obstructive sleep apnea (adult) (pediatric): Secondary | ICD-10-CM | POA: Diagnosis not present

## 2021-11-04 HISTORY — PX: COLONOSCOPY: SHX174

## 2021-11-07 ENCOUNTER — Other Ambulatory Visit: Payer: Self-pay | Admitting: Medical

## 2021-11-13 ENCOUNTER — Other Ambulatory Visit: Payer: Federal, State, Local not specified - PPO

## 2021-11-19 ENCOUNTER — Encounter: Payer: Self-pay | Admitting: Internal Medicine

## 2021-11-19 DIAGNOSIS — Z1211 Encounter for screening for malignant neoplasm of colon: Secondary | ICD-10-CM | POA: Diagnosis not present

## 2021-11-19 DIAGNOSIS — K5730 Diverticulosis of large intestine without perforation or abscess without bleeding: Secondary | ICD-10-CM | POA: Diagnosis not present

## 2021-11-19 DIAGNOSIS — K635 Polyp of colon: Secondary | ICD-10-CM | POA: Diagnosis not present

## 2021-11-19 LAB — HM COLONOSCOPY

## 2021-11-20 ENCOUNTER — Encounter: Payer: Self-pay | Admitting: Medical

## 2021-11-25 ENCOUNTER — Encounter: Payer: Self-pay | Admitting: Internal Medicine

## 2021-12-01 DIAGNOSIS — G4733 Obstructive sleep apnea (adult) (pediatric): Secondary | ICD-10-CM | POA: Diagnosis not present

## 2021-12-07 ENCOUNTER — Other Ambulatory Visit: Payer: Self-pay | Admitting: Medical

## 2022-01-01 DIAGNOSIS — G4733 Obstructive sleep apnea (adult) (pediatric): Secondary | ICD-10-CM | POA: Diagnosis not present

## 2022-01-12 ENCOUNTER — Other Ambulatory Visit: Payer: Self-pay | Admitting: Medical

## 2022-01-15 ENCOUNTER — Other Ambulatory Visit: Payer: Federal, State, Local not specified - PPO

## 2022-02-02 DIAGNOSIS — G4733 Obstructive sleep apnea (adult) (pediatric): Secondary | ICD-10-CM | POA: Diagnosis not present

## 2022-02-16 ENCOUNTER — Other Ambulatory Visit: Payer: Self-pay | Admitting: Medical

## 2022-03-05 DIAGNOSIS — G4733 Obstructive sleep apnea (adult) (pediatric): Secondary | ICD-10-CM | POA: Diagnosis not present

## 2022-04-04 DIAGNOSIS — G4733 Obstructive sleep apnea (adult) (pediatric): Secondary | ICD-10-CM | POA: Diagnosis not present

## 2022-04-07 ENCOUNTER — Other Ambulatory Visit: Payer: Self-pay | Admitting: Medical

## 2022-05-03 DIAGNOSIS — G4733 Obstructive sleep apnea (adult) (pediatric): Secondary | ICD-10-CM | POA: Diagnosis not present

## 2022-05-05 DIAGNOSIS — G4733 Obstructive sleep apnea (adult) (pediatric): Secondary | ICD-10-CM | POA: Diagnosis not present

## 2022-05-12 ENCOUNTER — Encounter: Payer: Self-pay | Admitting: Internal Medicine

## 2022-05-27 ENCOUNTER — Other Ambulatory Visit: Payer: Self-pay | Admitting: Medical

## 2022-05-27 NOTE — Telephone Encounter (Signed)
Has upcoming appointment in [place]

## 2022-06-03 DIAGNOSIS — G4733 Obstructive sleep apnea (adult) (pediatric): Secondary | ICD-10-CM | POA: Diagnosis not present

## 2022-06-15 ENCOUNTER — Encounter: Payer: Self-pay | Admitting: Internal Medicine

## 2022-06-24 ENCOUNTER — Other Ambulatory Visit: Payer: Self-pay | Admitting: Medical

## 2022-06-25 NOTE — Telephone Encounter (Signed)
Refill request last apt 10/20/21 next apt 08/04/22.

## 2022-07-03 DIAGNOSIS — G4733 Obstructive sleep apnea (adult) (pediatric): Secondary | ICD-10-CM | POA: Diagnosis not present

## 2022-07-09 ENCOUNTER — Encounter: Payer: Federal, State, Local not specified - PPO | Admitting: Medical

## 2022-07-19 ENCOUNTER — Other Ambulatory Visit: Payer: Self-pay | Admitting: Medical

## 2022-07-24 DIAGNOSIS — M542 Cervicalgia: Secondary | ICD-10-CM | POA: Diagnosis not present

## 2022-07-24 DIAGNOSIS — M25551 Pain in right hip: Secondary | ICD-10-CM | POA: Diagnosis not present

## 2022-08-02 DIAGNOSIS — G4733 Obstructive sleep apnea (adult) (pediatric): Secondary | ICD-10-CM | POA: Diagnosis not present

## 2022-08-04 ENCOUNTER — Ambulatory Visit (INDEPENDENT_AMBULATORY_CARE_PROVIDER_SITE_OTHER): Payer: Federal, State, Local not specified - PPO | Admitting: Medical

## 2022-08-04 ENCOUNTER — Encounter: Payer: Self-pay | Admitting: Medical

## 2022-08-04 VITALS — BP 120/80 | HR 87 | Ht 71.0 in | Wt 237.0 lb

## 2022-08-04 DIAGNOSIS — M1611 Unilateral primary osteoarthritis, right hip: Secondary | ICD-10-CM | POA: Diagnosis not present

## 2022-08-04 DIAGNOSIS — R7301 Impaired fasting glucose: Secondary | ICD-10-CM

## 2022-08-04 DIAGNOSIS — E785 Hyperlipidemia, unspecified: Secondary | ICD-10-CM | POA: Diagnosis not present

## 2022-08-04 DIAGNOSIS — J4520 Mild intermittent asthma, uncomplicated: Secondary | ICD-10-CM | POA: Diagnosis not present

## 2022-08-04 DIAGNOSIS — Z7185 Encounter for immunization safety counseling: Secondary | ICD-10-CM

## 2022-08-04 DIAGNOSIS — J301 Allergic rhinitis due to pollen: Secondary | ICD-10-CM

## 2022-08-04 DIAGNOSIS — Z125 Encounter for screening for malignant neoplasm of prostate: Secondary | ICD-10-CM | POA: Diagnosis not present

## 2022-08-04 DIAGNOSIS — Z6833 Body mass index (BMI) 33.0-33.9, adult: Secondary | ICD-10-CM

## 2022-08-04 DIAGNOSIS — Z0000 Encounter for general adult medical examination without abnormal findings: Secondary | ICD-10-CM

## 2022-08-04 NOTE — Progress Notes (Signed)
Subjective:   HPI  Mark Arroyo is a 51 y.o. male who presents for Chief Complaint  Patient presents with   fasting cpe    Fasting cpe, no concerns. Declines vaccines    Patient Care Team: Girlie Veltri, Kermit Balo, PA-C as PCP - General (Family Medicine) Sees dentist Sees eye doctor  Concerns: Has 2 healing skin lesions on left dorsal foot from yellow jacket bite from over a month ago  He notes a hip issues 2-3 weeks ago, arthritis vs hip flexor issue.  Seeing ortho in follow up today.     Reviewed their medical, surgical, family, social, medication, and allergy history and updated chart as appropriate.  Past Medical History:  Diagnosis Date   Asthma    GERD (gastroesophageal reflux disease)    intermittent   Migraines    as adult, age 64, usually stress related, no frequent headaches since 04/2014   Seasonal allergic rhinitis    Wears glasses     Past Surgical History:  Procedure Laterality Date   ANTERIOR CERVICAL DECOMP/DISCECTOMY FUSION  09/07/2011   Dr. Yevette Edwards   COLONOSCOPY  43   due to stomach issues, normal, [place]   INGUINAL HERNIA REPAIR  09/06/2001   right   MEDIAL COLLATERAL LIGAMENT REPAIR, KNEE  09/06/2009   left   NASAL SINUS SURGERY  06/25/2015   x2,    SHOULDER SURGERY Left 08/06/2013   left, labrum repair, RTC repair, spur removal; [place]   VASECTOMY  09/07/2011    Family History  Problem Relation Age of Onset   Diabetes Mother    Heart disease Father 22       MI   Prostatitis Father    Cancer Sister    Kidney disease Sister    Diabetes Sister    Diabetes Brother      Current Outpatient Medications:    albuterol (VENTOLIN HFA) 108 (90 Base) MCG/ACT inhaler, INHALE 2 PUFFS BY MOUTH EVERY 6 HOURS AS NEEDED FOR WHEEZING OR SHORTNESS OF BREATH, Disp: 8.5 each, Rfl: 0   Azelastine HCl 137 MCG/SPRAY SOLN, Place 2 sprays into both nostrils 2 (two) times daily., Disp: , Rfl:    methocarbamol (ROBAXIN) 500 MG tablet, Take 500 mg by  mouth 3 (three) times daily as needed., Disp: , Rfl:    Vitamin D, Ergocalciferol, (DRISDOL) 1.25 MG (50000 UNIT) CAPS capsule, Take 50,000 Units by mouth once a week., Disp: , Rfl:    fluticasone (FLOVENT DISKUS) 50 MCG/BLIST diskus inhaler, Inhale 1 puff into the lungs 2 (two) times daily. (Patient not taking: Reported on 08/04/2022), Disp: 1 each, Rfl: 2   metFORMIN (GLUCOPHAGE XR) 500 MG 24 hr tablet, Take 1 tablet (500 mg total) by mouth daily with breakfast., Disp: 90 tablet, Rfl: 0   rosuvastatin (CRESTOR) 10 MG tablet, Take 1 tablet (10 mg total) by mouth daily., Disp: 90 tablet, Rfl: 3 No current facility-administered medications for this visit.  Facility-Administered Medications Ordered in Other Visits:    gadopentetate dimeglumine (MAGNEVIST) injection 20 mL, 20 mL, Intravenous, Once PRN, Penumalli, Vikram R, MD  No Known Allergies   Review of Systems Constitutional: -fever, -chills, -sweats, -unexpected weight change, -decreased appetite, -fatigue Allergy: -sneezing, -itching, -congestion Dermatology: -changing moles, --rash, -lumps ENT: -runny nose, -ear pain, -sore throat, -hoarseness, -sinus pain, -teeth pain, - ringing in ears, -hearing loss, -nosebleeds Cardiology: -chest pain, -palpitations, -swelling, -difficulty breathing when lying flat, -waking up short of breath Respiratory: -cough, -shortness of breath, -difficulty breathing with exercise or exertion, -wheezing, -  coughing up blood Gastroenterology: -abdominal pain, -nausea, -vomiting, -diarrhea, -constipation, -blood in stool, -changes in bowel movement, -difficulty swallowing or eating Hematology: -bleeding, -bruising  Musculoskeletal: +joint aches, -muscle aches, -joint swelling, -back pain, -neck pain, -cramping, -changes in gait Ophthalmology: denies vision changes, eye redness, itching, discharge Urology: -burning with urination, -difficulty urinating, -blood in urine, -urinary frequency, -urgency,  -incontinence Neurology: -headache, -weakness, -tingling, -numbness, -memory loss, -falls, -dizziness Psychology: -depressed mood, -agitation, -sleep problems Male GU: no testicular mass, pain, no lymph nodes swollen, no swelling, no rash.     08/04/2022    8:32 AM 10/20/2021    8:24 AM 07/08/2021   10:23 AM 06/11/2020    1:43 PM 02/27/2018    9:39 AM  Depression screen PHQ 2/9  Decreased Interest 0 0 0 0 0  Down, Depressed, Hopeless 0 0 0 0 0  PHQ - 2 Score 0 0 0 0 0        Objective:  BP 120/80   Pulse 87   Ht 5\' 11"  (1.803 m)   Wt 237 lb (107.5 kg)   BMI 33.05 kg/m   General appearance: alert, no distress, WD/WN, African American male Skin: unremarkable HEENT: normocephalic, conjunctiva/corneas normal, sclerae anicteric, PERRLA, EOMi, nares patent, no discharge or erythema, pharynx normal Oral cavity: MMM, tongue normal, teeth in good repair Neck: supple, no lymphadenopathy, no thyromegaly, no masses, normal ROM, no bruits Chest: non tender, normal shape and expansion Heart: RRR, normal S1, S2, no murmurs Lungs: CTA bilaterally, no wheezes, rhonchi, or rales Abdomen: +bs, soft, non tender, non distended, no masses, no hepatomegaly, no splenomegaly, no bruits Back: non tender, normal ROM, no scoliosis Musculoskeletal: upper extremities non tender, no obvious deformity, normal ROM throughout, lower extremities non tender, no obvious deformity, normal ROM throughout Extremities: no edema, no cyanosis, no clubbing Pulses: 2+ symmetric, upper and lower extremities, normal cap refill Neurological: alert, oriented x 3, CN2-12 intact, strength normal upper extremities and lower extremities, sensation normal throughout, DTRs 2+ throughout, no cerebellar signs, gait normal Psychiatric: normal affect, behavior normal, pleasant  GU: normal male external genitalia,circumcised, nontender, no masses, no hernia, no lymphadenopathy Rectal: anus normal tone, prostate mildly  enlarged    Assessment and Plan :   Encounter Diagnoses  Name Primary?   Encounter for health maintenance examination in adult Yes   Screening for prostate cancer    Impaired fasting blood sugar    Mild intermittent asthma without complication    Allergic rhinitis due to pollen, unspecified seasonality    BMI 33.0-33.9,adult    Hyperlipidemia, unspecified hyperlipidemia type    Vaccine counseling     This visit was a preventative care visit, also known as wellness visit or routine physical.   Topics typically include healthy lifestyle, diet, exercise, preventative care, vaccinations, sick and well care, proper use of emergency dept and after hours care, as well as other concerns.     Recommendations: Continue to return yearly for your annual wellness and preventative care visits.  This gives 02-14-1980 a chance to discuss healthy lifestyle, exercise, vaccinations, review your chart record, and perform screenings where appropriate.  I recommend you see your eye doctor yearly for routine vision care.  I recommend you see your dentist yearly for routine dental care including hygiene visits twice yearly.   Vaccination recommendations were reviewed Immunization History  Administered Date(s) Administered   Pneumococcal Conjugate-13 07/08/2021   Pneumococcal Polysaccharide-23 12/12/2013, 01/15/2015   Td 09/06/2005, 02/04/2013   Tdap 10/26/2011    Advised flu  shot, covid vaccine and Shingrix   Screening for cancer: Colon cancer screening: I reviewed your colonoscopy on file that is up to date from 2023  We discussed PSA, prostate exam, and prostate cancer screening risks/benefits.     Skin cancer screening: Check your skin regularly for new changes, growing lesions, or other lesions of concern Come in for evaluation if you have skin lesions of concern.  Lung cancer screening: If you have a greater than 20 pack year history of tobacco use, then you may qualify for lung cancer  screening with a chest CT scan.   Please call your insurance company to inquire about coverage for this test.  We currently don't have screenings for other cancers besides breast, cervical, colon, and lung cancers.  If you have a strong family history of cancer or have other cancer screening concerns, please let me know.    Bone health: Get at least 150 minutes of aerobic exercise weekly Get weight bearing exercise at least once weekly Bone density test:  A bone density test is an imaging test that uses a type of X-ray to measure the amount of calcium and other minerals in your bones. The test may be used to diagnose or screen you for a condition that causes weak or thin bones (osteoporosis), predict your risk for a broken bone (fracture), or determine how well your osteoporosis treatment is working. The bone density test is recommended for females 65 and older, or females or males <65 if certain risk factors such as thyroid disease, long term use of steroids such as for asthma or rheumatological issues, vitamin D deficiency, estrogen deficiency, family history of osteoporosis, self or family history of fragility fracture in first degree relative.    Heart health: Get at least 150 minutes of aerobic exercise weekly Limit alcohol It is important to maintain a healthy blood pressure and healthy cholesterol numbers  Heart disease screening: Screening for heart disease includes screening for blood pressure, fasting lipids, glucose/diabetes screening, BMI height to weight ratio, reviewed of smoking status, physical activity, and diet.    Goals include blood pressure 120/80 or less, maintaining a healthy lipid/cholesterol profile, preventing diabetes or keeping diabetes numbers under good control, not smoking or using tobacco products, exercising most days per week or at least 150 minutes per week of exercise, and eating healthy variety of fruits and vegetables, healthy oils, and avoiding unhealthy  food choices like fried food, fast food, high sugar and high cholesterol foods.    Other tests may possibly include EKG test, CT coronary calcium score, echocardiogram, exercise treadmill stress test.    Medical care options: I recommend you continue to seek care here first for routine care.  We try really hard to have available appointments Monday through Friday daytime hours for sick visits, acute visits, and physicals.  Urgent care should be used for after hours and weekends for significant issues that cannot wait till the next day.  The emergency department should be used for significant potentially life-threatening emergencies.  The emergency department is expensive, can often have long wait times for less significant concerns, so try to utilize primary care, urgent care, or telemedicine when possible to avoid unnecessary trips to the emergency department.  Virtual visits and telemedicine have been introduced since the pandemic started in 2020, and can be convenient ways to receive medical care.  We offer virtual appointments as well to assist you in a variety of options to seek medical care.   Advanced Directives: I recommend  you consider completing a Health Care Power of Attorney and Living Will.   These documents respect your wishes and help alleviate burdens on your loved ones if you were to become terminally ill or be in a position to need those documents enforced.    You can complete Advanced Directives yourself, have them notarized, then have copies made for our office, for you and for anybody you feel should have them in safe keeping.  Or, you can have an attorney prepare these documents.   If you haven't updated your Last Will and Testament in a while, it may be worthwhile having an attorney prepare these documents together and save on some costs.       Separate significant issues discussed: Impaired glucose - updated labs today.   Ran out of medication months ago and hasn't been back  to follow up.  Asthma, mild intermittent  - continue albuterol as needed  Hyperlipidemia -  he notes not taking Crestor since 02/2022, although we had sent refills for a while year from 07/2021  Marcial Pacasimothy was seen today for fasting cpe.  Diagnoses and all orders for this visit:  Encounter for health maintenance examination in adult -     Comprehensive metabolic panel -     CBC -     PSA -     Hemoglobin A1c -     Lipid panel  Screening for prostate cancer -     PSA  Impaired fasting blood sugar -     Hemoglobin A1c  Mild intermittent asthma without complication  Allergic rhinitis due to pollen, unspecified seasonality  BMI 33.0-33.9,adult  Hyperlipidemia, unspecified hyperlipidemia type -     Lipid panel  Vaccine counseling   Follow-up pending labs, yearly for physical

## 2022-08-05 ENCOUNTER — Other Ambulatory Visit: Payer: Self-pay | Admitting: Medical

## 2022-08-05 LAB — COMPREHENSIVE METABOLIC PANEL
ALT: 16 IU/L (ref 0–44)
AST: 12 IU/L (ref 0–40)
Albumin/Globulin Ratio: 1.4 (ref 1.2–2.2)
Albumin: 4.4 g/dL (ref 4.1–5.1)
Alkaline Phosphatase: 87 IU/L (ref 44–121)
BUN/Creatinine Ratio: 10 (ref 9–20)
BUN: 11 mg/dL (ref 6–24)
Bilirubin Total: 0.9 mg/dL (ref 0.0–1.2)
CO2: 25 mmol/L (ref 20–29)
Calcium: 9.6 mg/dL (ref 8.7–10.2)
Chloride: 98 mmol/L (ref 96–106)
Creatinine, Ser: 1.05 mg/dL (ref 0.76–1.27)
Globulin, Total: 3.1 g/dL (ref 1.5–4.5)
Glucose: 127 mg/dL — ABNORMAL HIGH (ref 70–99)
Potassium: 4.4 mmol/L (ref 3.5–5.2)
Sodium: 137 mmol/L (ref 134–144)
Total Protein: 7.5 g/dL (ref 6.0–8.5)
eGFR: 86 mL/min/{1.73_m2} (ref >59)

## 2022-08-05 LAB — LIPID PANEL
Chol/HDL Ratio: 4 ratio (ref 0.0–5.0)
Cholesterol, Total: 202 mg/dL — ABNORMAL HIGH (ref 100–199)
HDL: 51 mg/dL (ref >39)
LDL Chol Calc (NIH): 124 mg/dL — ABNORMAL HIGH (ref 0–99)
Triglycerides: 152 mg/dL — ABNORMAL HIGH (ref 0–149)
VLDL Cholesterol Cal: 27 mg/dL (ref 5–40)

## 2022-08-05 LAB — CBC
Hematocrit: 45.5 % (ref 37.5–51.0)
Hemoglobin: 15 g/dL (ref 13.0–17.7)
MCH: 26.2 pg — ABNORMAL LOW (ref 26.6–33.0)
MCHC: 33 g/dL (ref 31.5–35.7)
MCV: 80 fL (ref 79–97)
Platelets: 279 10*3/uL (ref 150–450)
RBC: 5.72 x10E6/uL (ref 4.14–5.80)
RDW: 12.5 % (ref 11.6–15.4)
WBC: 5.8 10*3/uL (ref 3.4–10.8)

## 2022-08-05 LAB — PSA: Prostate Specific Ag, Serum: 1.1 ng/mL (ref 0.0–4.0)

## 2022-08-05 LAB — HEMOGLOBIN A1C
Est. average glucose Bld gHb Est-mCnc: 154 mg/dL
Hgb A1c MFr Bld: 7 % — ABNORMAL HIGH (ref 4.8–5.6)

## 2022-08-05 MED ORDER — METFORMIN HCL ER 500 MG PO TB24: 500.0000 mg | ORAL_TABLET | Freq: Every day | ORAL | 2 refills | Status: DC

## 2022-08-05 MED ORDER — ROSUVASTATIN CALCIUM 10 MG PO TABS: 10.0000 mg | ORAL_TABLET | Freq: Every day | ORAL | 2 refills | Status: DC

## 2022-08-10 ENCOUNTER — Other Ambulatory Visit: Payer: Self-pay | Admitting: Medical

## 2022-08-10 NOTE — Telephone Encounter (Signed)
Refill request last apt 08/04/22

## 2022-09-01 DIAGNOSIS — G4733 Obstructive sleep apnea (adult) (pediatric): Secondary | ICD-10-CM | POA: Diagnosis not present

## 2022-09-02 ENCOUNTER — Other Ambulatory Visit: Payer: Self-pay | Admitting: Medical

## 2022-10-02 DIAGNOSIS — G4733 Obstructive sleep apnea (adult) (pediatric): Secondary | ICD-10-CM | POA: Diagnosis not present

## 2022-11-01 DIAGNOSIS — M791 Myalgia, unspecified site: Secondary | ICD-10-CM | POA: Diagnosis not present

## 2022-11-01 DIAGNOSIS — Z20822 Contact with and (suspected) exposure to covid-19: Secondary | ICD-10-CM | POA: Diagnosis not present

## 2022-11-01 DIAGNOSIS — H6691 Otitis media, unspecified, right ear: Secondary | ICD-10-CM | POA: Diagnosis not present

## 2022-11-01 DIAGNOSIS — J069 Acute upper respiratory infection, unspecified: Secondary | ICD-10-CM | POA: Diagnosis not present

## 2022-11-02 ENCOUNTER — Other Ambulatory Visit: Payer: Self-pay | Admitting: Medical

## 2022-11-05 ENCOUNTER — Telehealth: Payer: Self-pay | Admitting: Medical

## 2022-11-05 NOTE — Telephone Encounter (Signed)
Mark Arroyo requesting refill on his albuterol, he has a med check scheduled for 02/03/23 [id number] [place], [place] - [address]

## 2022-11-06 ENCOUNTER — Other Ambulatory Visit: Payer: Self-pay | Admitting: Medical

## 2022-11-06 MED ORDER — ALBUTEROL SULFATE HFA 108 (90 BASE) MCG/ACT IN AERS: INHALATION_SPRAY | RESPIRATORY_TRACT | 0 refills | Status: DC

## 2022-11-30 ENCOUNTER — Other Ambulatory Visit: Payer: Self-pay | Admitting: Medical

## 2022-12-02 DIAGNOSIS — G4733 Obstructive sleep apnea (adult) (pediatric): Secondary | ICD-10-CM | POA: Diagnosis not present

## 2022-12-30 ENCOUNTER — Other Ambulatory Visit: Payer: Self-pay | Admitting: Medical

## 2022-12-30 NOTE — Telephone Encounter (Signed)
Refill request last apt. 08/04/22 next apt 02/03/23.

## 2022-12-31 DIAGNOSIS — M1611 Unilateral primary osteoarthritis, right hip: Secondary | ICD-10-CM | POA: Diagnosis not present

## 2022-12-31 DIAGNOSIS — M79651 Pain in right thigh: Secondary | ICD-10-CM | POA: Diagnosis not present

## 2023-01-02 DIAGNOSIS — G4733 Obstructive sleep apnea (adult) (pediatric): Secondary | ICD-10-CM | POA: Diagnosis not present

## 2023-01-26 ENCOUNTER — Other Ambulatory Visit: Payer: Self-pay | Admitting: Medical

## 2023-01-27 NOTE — Telephone Encounter (Signed)
Refill request last apt. 08/04/22 next apt 02/03/23. 

## 2023-02-01 DIAGNOSIS — G4733 Obstructive sleep apnea (adult) (pediatric): Secondary | ICD-10-CM | POA: Diagnosis not present

## 2023-02-03 ENCOUNTER — Encounter: Payer: Federal, State, Local not specified - PPO | Admitting: Medical

## 2023-02-11 DIAGNOSIS — M1611 Unilateral primary osteoarthritis, right hip: Secondary | ICD-10-CM | POA: Diagnosis not present

## 2023-02-28 ENCOUNTER — Ambulatory Visit: Payer: Federal, State, Local not specified - PPO | Admitting: Medical

## 2023-02-28 ENCOUNTER — Encounter: Payer: Self-pay | Admitting: Medical

## 2023-02-28 VITALS — BP 138/88 | HR 74 | Resp 16 | Wt 247.0 lb

## 2023-02-28 DIAGNOSIS — E782 Mixed hyperlipidemia: Secondary | ICD-10-CM | POA: Diagnosis not present

## 2023-02-28 DIAGNOSIS — M7989 Other specified soft tissue disorders: Secondary | ICD-10-CM

## 2023-02-28 DIAGNOSIS — E118 Type 2 diabetes mellitus with unspecified complications: Secondary | ICD-10-CM

## 2023-02-28 DIAGNOSIS — E559 Vitamin D deficiency, unspecified: Secondary | ICD-10-CM | POA: Diagnosis not present

## 2023-02-28 DIAGNOSIS — Z23 Encounter for immunization: Secondary | ICD-10-CM | POA: Diagnosis not present

## 2023-02-28 NOTE — Patient Instructions (Signed)
Regarding joint pains Consider taking a supplement such as over the counter Fish Oil or over the counter Glucosamine Chondroitin daily Consider oral herbal Turmeric which is a natural anti-inflammatory You can use cold therapy when desired or after exercise Consider topical Capsacian or Aspercreme over the counter as needed

## 2023-02-28 NOTE — Progress Notes (Signed)
Subjective:  Mark Arroyo is a 52 y.o. male who presents for Chief Complaint  Patient presents with   Medical Management of Chronic Issues    6 month medication recheck.    Hyperlipidemia   Diabetes     Here for 72mo follow on chronic issues.  Diabetes Compliant with metformin once daily since last visit 6 months ago.  Currently doesn't have a glucometer.  Concerned about weight.  Works from home, eats vegetables and fruit, but having trouble losing weight.  Hyperlipidemia  Compliant with crestor without complaint, started back 6 months ago on statin  Having some hip pains that limits exercise.  On meloxicam but that was causing some ankle and feet swelling at times.  Sees [facility] for this.  Uses peloton for exercise.  Starts to get some hip pains about 20 minutes into exercise.  Did a 24 fast last week one day.  No other aggravating or relieving factors.    No other c/o.  Past Medical History:  Diagnosis Date   Asthma    GERD (gastroesophageal reflux disease)    intermittent   Migraines    as adult, age 69, usually stress related, no frequent headaches since 04/2014   Seasonal allergic rhinitis    Wears glasses    Current Outpatient Medications on File Prior to Visit  Medication Sig Dispense Refill   albuterol (VENTOLIN HFA) 108 (90 Base) MCG/ACT inhaler INHALE 2 PUFFS BY MOUTH EVERY 6 HOURS AS NEEDED FOR WHEEZING OR SHORTNESS OF BREATH 6.7 each 0   Azelastine HCl 137 MCG/SPRAY SOLN Place 2 sprays into both nostrils 2 (two) times daily.     metFORMIN (GLUCOPHAGE XR) 500 MG 24 hr tablet Take 1 tablet (500 mg total) by mouth daily with breakfast. 90 tablet 2   methocarbamol (ROBAXIN) 500 MG tablet Take 500 mg by mouth 3 (three) times daily as needed.     rosuvastatin (CRESTOR) 10 MG tablet Take 1 tablet (10 mg total) by mouth daily. 90 tablet 2   Vitamin D, Ergocalciferol, (DRISDOL) 1.25 MG (50000 UNIT) CAPS capsule Take 50,000 Units by mouth once a week.      fluticasone (FLOVENT DISKUS) 50 MCG/BLIST diskus inhaler Inhale 1 puff into the lungs 2 (two) times daily. (Patient not taking: Reported on 08/04/2022) 1 each 2   Current Facility-Administered Medications on File Prior to Visit  Medication Dose Route Frequency Provider Last Rate Last Admin   gadopentetate dimeglumine (MAGNEVIST) injection 20 mL  20 mL Intravenous Once PRN Penumalli, Glenford Bayley, MD        The following portions of the patient's history were reviewed and updated as appropriate: allergies, current medications, past family history, past medical history, past social history, past surgical history and problem list.  ROS Otherwise as in subjective above    Objective: BP 138/88   Pulse 74   Resp 16   Wt 247 lb (112 kg)   SpO2 95% Comment: room air  BMI 34.45 kg/m   Wt Readings from Last 3 Encounters:  02/28/23 247 lb (112 kg)  08/04/22 237 lb (107.5 kg)  10/20/21 246 lb (111.6 kg)   BP Readings from Last 3 Encounters:  02/28/23 138/88  08/04/22 120/80  10/20/21 110/70    General appearance: alert, no distress, well developed, well nourished Heart: RRR, normal S1, S2, no murmurs Lungs: CTA bilaterally, no wheezes, rhonchi, or rales Pulses: 2+ radial pulses, 2+ pedal pulses, normal cap refill Ext: no edema   Diabetic Foot Exam -  Simple   Simple Foot Form Diabetic Foot exam was performed with the following findings: Yes 02/28/2023 12:39 PM  Visual Inspection No deformities, no ulcerations, no other skin breakdown bilaterally: Yes Sensation Testing Intact to touch and monofilament testing bilaterally: Yes Pulse Check Posterior Tibialis and Dorsalis pulse intact bilaterally: Yes Comments      Assessment: Encounter Diagnoses  Name Primary?   Type II diabetes mellitus with complication (HCC) Yes   Mixed dyslipidemia    Vitamin D deficiency    Bilateral swelling of feet    Need for Tdap vaccination      Plan: Diabetes We discussed his current medicine  versus considering weekly injectable such as Ozempic or Mounjaro to help with weight loss appetite and diabetes control Counseled on diet and exercise.  Unfortunately eats junk food every day and does some late-night eating for example cookie at 2 AM recently. New prescription for glucometer and testing supplies today. Labs as below  Mixed dyslipidemia Continue statin, counseled on diet, exercise  Vitamin D deficiency - compliant with supplement, updated labs today    Vaccines counseling: You are due for Tdap tetanus booster and Shingrix vaccine.    Counseled on the Tdap (tetanus, diptheria, and acellular pertussis) vaccine.  Vaccine information sheet given. Tdap vaccine given after consent obtained.   Mark Arroyo was seen today for medical management of chronic issues, hyperlipidemia and diabetes.  Diagnoses and all orders for this visit:  Type II diabetes mellitus with complication (HCC) -     Comprehensive metabolic panel -     Hemoglobin A1c -     Microalbumin/Creatinine Ratio, Urine  Mixed dyslipidemia  Vitamin D deficiency -     VITAMIN D 25 Hydroxy (Vit-D Deficiency, Fractures)  Bilateral swelling of feet  Need for Tdap vaccination    Follow up: pending labs

## 2023-02-28 NOTE — Addendum Note (Signed)
Addended by: Benjiman Core on: 02/28/2023 01:09 PM   Modules accepted: Orders

## 2023-03-01 ENCOUNTER — Other Ambulatory Visit: Payer: Self-pay | Admitting: Medical

## 2023-03-01 LAB — COMPREHENSIVE METABOLIC PANEL
ALT: 23 IU/L (ref 0–44)
AST: 20 IU/L (ref 0–40)
Albumin: 4.5 g/dL (ref 4.1–5.1)
Alkaline Phosphatase: 79 IU/L (ref 44–121)
BUN/Creatinine Ratio: 7 — ABNORMAL LOW (ref 9–20)
BUN: 7 mg/dL (ref 6–24)
Bilirubin Total: 0.9 mg/dL (ref 0.0–1.2)
CO2: 23 mmol/L (ref 20–29)
Calcium: 9.3 mg/dL (ref 8.7–10.2)
Chloride: 100 mmol/L (ref 96–106)
Creatinine, Ser: 0.98 mg/dL (ref 0.76–1.27)
Globulin, Total: 2.7 g/dL (ref 1.5–4.5)
Glucose: 102 mg/dL — ABNORMAL HIGH (ref 70–99)
Potassium: 4.2 mmol/L (ref 3.5–5.2)
Sodium: 138 mmol/L (ref 134–144)
Total Protein: 7.2 g/dL (ref 6.0–8.5)
eGFR: 94 mL/min/{1.73_m2} (ref >59)

## 2023-03-01 LAB — MICROALBUMIN / CREATININE URINE RATIO
Creatinine, Urine: 95.1 mg/dL
Microalb/Creat Ratio: <3 mg/g creat (ref 0–29)
Microalbumin, Urine: <3 ug/mL

## 2023-03-01 LAB — VITAMIN D 25 HYDROXY (VIT D DEFICIENCY, FRACTURES): Vit D, 25-Hydroxy: 10 ng/mL — ABNORMAL LOW (ref 30.0–100.0)

## 2023-03-01 LAB — HEMOGLOBIN A1C
Est. average glucose Bld gHb Est-mCnc: 146 mg/dL
Hgb A1c MFr Bld: 6.7 % — ABNORMAL HIGH (ref 4.8–5.6)

## 2023-03-01 MED ORDER — VITAMIN D 125 MCG (5000 UT) PO CAPS: 1.0000 | ORAL_CAPSULE | Freq: Every day | ORAL | 0 refills | Status: DC

## 2023-03-01 MED ORDER — ROSUVASTATIN CALCIUM 10 MG PO TABS: 10.0000 mg | ORAL_TABLET | Freq: Every day | ORAL | 3 refills | Status: DC

## 2023-03-01 NOTE — Progress Notes (Signed)
Results sent through MyChart

## 2023-03-14 ENCOUNTER — Other Ambulatory Visit: Payer: Self-pay | Admitting: Medical

## 2023-03-16 ENCOUNTER — Telehealth: Payer: Self-pay | Admitting: Medical

## 2023-03-16 NOTE — Telephone Encounter (Signed)
Please call pt got lab results and your message. Patient wants to know about Mark Arroyo,  He is going to check with his insurance also to see what is covered

## 2023-03-17 ENCOUNTER — Other Ambulatory Visit: Payer: Self-pay | Admitting: Medical

## 2023-03-17 MED ORDER — SEMAGLUTIDE(0.25 OR 0.5MG/DOS) 2 MG/3ML ~~LOC~~ SOPN: 0.2500 mg | PEN_INJECTOR | SUBCUTANEOUS | 0 refills | Status: DC

## 2023-03-17 NOTE — Telephone Encounter (Signed)
Pt was notified.  

## 2023-03-18 NOTE — Telephone Encounter (Signed)
Pt informed

## 2023-03-21 NOTE — Telephone Encounter (Signed)
Pt called and states he would like to wegovy instead of ozempic.

## 2023-03-22 ENCOUNTER — Other Ambulatory Visit: Payer: Self-pay | Admitting: Medical

## 2023-03-22 MED ORDER — WEGOVY 0.25 MG/0.5ML ~~LOC~~ SOAJ: 0.2500 mg | SUBCUTANEOUS | 0 refills | Status: DC

## 2023-03-22 NOTE — Telephone Encounter (Signed)
Called and spoke to patient and he said he started 0.25mg  of Ozempic as it was approved.

## 2023-03-22 NOTE — Addendum Note (Signed)
Addended by: Herminio Commons A on: 03/22/2023 10:09 AM   Modules accepted: Orders

## 2023-03-27 ENCOUNTER — Telehealth: Payer: Self-pay | Admitting: Medical

## 2023-03-27 NOTE — Telephone Encounter (Signed)
(  KeyLanny Cramp) Rx #: [id number] NFAOZH 0.25MG /0.5ML auto-injectors Form [employer] Electronic PA Form approved til 09/23/23, sent mychart message

## 2023-04-08 DIAGNOSIS — G4733 Obstructive sleep apnea (adult) (pediatric): Secondary | ICD-10-CM | POA: Diagnosis not present

## 2023-04-11 ENCOUNTER — Other Ambulatory Visit: Payer: Self-pay | Admitting: Medical

## 2023-04-13 ENCOUNTER — Telehealth: Payer: Self-pay | Admitting: Medical

## 2023-04-13 MED ORDER — OZEMPIC (0.25 OR 0.5 MG/DOSE) 2 MG/3ML ~~LOC~~ SOPN: 0.5000 mg | PEN_INJECTOR | SUBCUTANEOUS | 1 refills | Status: DC

## 2023-04-13 NOTE — Telephone Encounter (Signed)
Pt just finished .25 dosage Ozempic for month, no side effects, doing well, would like to go up to .5 mg said Vincenza Hews told him to call back after a month to let us know how he is doing & about increasing dosage.  Please let pt know

## 2023-05-09 ENCOUNTER — Other Ambulatory Visit: Payer: Self-pay | Admitting: Medical

## 2023-05-09 DIAGNOSIS — G4733 Obstructive sleep apnea (adult) (pediatric): Secondary | ICD-10-CM | POA: Diagnosis not present

## 2023-05-16 ENCOUNTER — Other Ambulatory Visit: Payer: Self-pay | Admitting: Medical

## 2023-06-08 DIAGNOSIS — G4733 Obstructive sleep apnea (adult) (pediatric): Secondary | ICD-10-CM | POA: Diagnosis not present

## 2023-06-09 ENCOUNTER — Other Ambulatory Visit: Payer: Self-pay | Admitting: Medical

## 2023-07-02 ENCOUNTER — Other Ambulatory Visit: Payer: Self-pay | Admitting: Medical

## 2023-07-07 DIAGNOSIS — G4733 Obstructive sleep apnea (adult) (pediatric): Secondary | ICD-10-CM | POA: Diagnosis not present

## 2023-07-09 DIAGNOSIS — G4733 Obstructive sleep apnea (adult) (pediatric): Secondary | ICD-10-CM | POA: Diagnosis not present

## 2023-07-25 ENCOUNTER — Telehealth: Payer: Self-pay

## 2023-07-25 NOTE — Telephone Encounter (Signed)
Key: [id number] Drug: Ozempic (0.25 or 0.5 MG/DOSE) 2MG Ronny Bacon pen-injectors Form: Ambulance person PA Form (2017 NCPDP) Determination: Wait for Determination

## 2023-07-28 ENCOUNTER — Other Ambulatory Visit: Payer: Self-pay | Admitting: Family Medicine

## 2023-07-31 ENCOUNTER — Other Ambulatory Visit: Payer: Self-pay | Admitting: Medical

## 2023-08-06 DIAGNOSIS — G4733 Obstructive sleep apnea (adult) (pediatric): Secondary | ICD-10-CM | POA: Diagnosis not present

## 2023-08-10 ENCOUNTER — Encounter: Payer: Federal, State, Local not specified - PPO | Admitting: Medical

## 2023-08-11 NOTE — Telephone Encounter (Signed)
Pt picked up 08/10/23.

## 2023-08-11 NOTE — Telephone Encounter (Signed)
Key: [id number] dRUG: Ozempic (0.25 or 0.5 MG/DOSE) 2MG Ronny Bacon pen-injectors Form: Ambulance person PA Form (2017 NCPDP) Determination: Favorable  Message from Plan:  Your PA request has been approved. Additional information will be provided in the approval communication.   Authorization Expiration Date: July 24, 2024.

## 2023-08-12 ENCOUNTER — Other Ambulatory Visit: Payer: Self-pay | Admitting: Medical

## 2023-08-12 NOTE — Telephone Encounter (Signed)
Has an appt in January 

## 2023-08-19 DIAGNOSIS — M1711 Unilateral primary osteoarthritis, right knee: Secondary | ICD-10-CM | POA: Diagnosis not present

## 2023-09-06 DIAGNOSIS — G4733 Obstructive sleep apnea (adult) (pediatric): Secondary | ICD-10-CM | POA: Diagnosis not present

## 2023-09-07 ENCOUNTER — Ambulatory Visit
Admission: RE | Admit: 2023-09-07 | Discharge: 2023-09-07 | Disposition: A | Discharge status: Home / Self Care | Payer: BC Managed Care – PPO | Source: Ambulatory Visit | Attending: Emergency Medicine | Admitting: Emergency Medicine

## 2023-09-07 VITALS — BP 114/74 | HR 103 | Temp 99.0°F | Resp 18

## 2023-09-07 DIAGNOSIS — R0602 Shortness of breath: Secondary | ICD-10-CM

## 2023-09-07 DIAGNOSIS — J4521 Mild intermittent asthma with (acute) exacerbation: Secondary | ICD-10-CM | POA: Diagnosis not present

## 2023-09-07 MED ORDER — AZITHROMYCIN 250 MG PO TABS: 250.0000 mg | ORAL_TABLET | Freq: Every day | ORAL | 0 refills | Status: DC

## 2023-09-07 MED ORDER — PREDNISONE 10 MG PO TABS: 40.0000 mg | ORAL_TABLET | Freq: Every day | ORAL | 0 refills | Status: AC

## 2023-09-07 NOTE — ED Triage Notes (Signed)
 Patient to Urgent Care with complaints of wheezing/ body aches/ cough/ nasal congestion. Denies any known fevers.  Symptoms x6 days.   Meds; Alker Seltzer cold and flu/ sudafed/ Mucinex.

## 2023-09-07 NOTE — ED Provider Notes (Signed)
 Mark Arroyo    CSN: [id number] Arrival date & time: 09/07/23  9071      History   Chief Complaint Chief Complaint  Patient presents with   Wheezing    Aches, runny nose, cough and sore throat - Entered by patient    HPI BRADY Arroyo is a 53 y.o. male.  Patient presents with 1 week history of congestion, cough, wheezing, shortness of breath, body aches.  He has been using his albuterol  inhaler.  He has been treating his symptoms with OTC cold medication; none taken today.  No fever or chest pain.  His medical history includes asthma, allergies, diabetes, hyperlipidemia, migraine headache, IBS, GERD.  The history is provided by the patient and medical records.    Past Medical History:  Diagnosis Date   Asthma    GERD (gastroesophageal reflux disease)    intermittent   Migraines    as adult, age 54, usually stress related, no frequent headaches since 04/2014   Seasonal allergic rhinitis    Wears glasses     Patient Active Problem List   Diagnosis Date Noted   Type II diabetes mellitus with complication (HCC) 02/28/2023   Mixed dyslipidemia 02/28/2023   Vitamin D  deficiency 02/28/2023   Bilateral swelling of feet 02/28/2023   Hyperlipidemia 08/04/2022   Vaccine counseling 08/04/2022   Hyperglycemia 07/08/2021   Need for pneumococcal vaccination 07/08/2021   BMI 33.0-33.9,adult 07/08/2021   Migraine 07/08/2021   Family history of premature CAD 02/23/2017   Screening for prostate cancer 02/23/2017   Impaired fasting blood sugar 02/23/2016   IBS (irritable bowel syndrome) 02/23/2016   Rhinitis, allergic 02/23/2016   Encounter for health maintenance examination in adult 02/23/2016   Asthma, mild intermittent 01/15/2015    Past Surgical History:  Procedure Laterality Date   ANTERIOR CERVICAL DECOMP/DISCECTOMY FUSION  09/07/2011   Dr. Beuford   COLONOSCOPY  43   due to stomach issues, normal, [place]   INGUINAL HERNIA REPAIR  09/06/2001   right    MEDIAL COLLATERAL LIGAMENT REPAIR, KNEE  09/06/2009   left   NASAL SINUS SURGERY  06/25/2015   x2,    SHOULDER SURGERY Left 08/06/2013   left, labrum repair, RTC repair, spur removal; [place]   VASECTOMY  09/07/2011       Home Medications    Prior to Admission medications   Medication Sig Start Date End Date Taking? Authorizing Provider  azithromycin  (ZITHROMAX ) 250 MG tablet Take 1 tablet (250 mg total) by mouth daily. Take first 2 tablets together, then 1 every day until finished. 09/07/23  Yes Corlis Burnard DEL, NP  predniSONE  (DELTASONE ) 10 MG tablet Take 4 tablets (40 mg total) by mouth daily for 5 days. 09/07/23 09/12/23 Yes Corlis Burnard DEL, NP  albuterol  (VENTOLIN  HFA) 108 (90 Base) MCG/ACT inhaler INHALE 2 PUFFS BY MOUTH EVERY 6 HOURS AS NEEDED FOR WHEEZING OR SHORTNESS OF BREATH 08/01/23   Tysinger, Alm GORMAN, PA-C  Azelastine HCl 137 MCG/SPRAY SOLN Place 2 sprays into both nostrils 2 (two) times daily. 03/25/21   [provider]  Cholecalciferol (VITAMIN D ) 125 MCG (5000 UT) CAPS Take 1 tablet by mouth daily. 03/01/23   Tysinger, Alm GORMAN, PA-C  fluticasone (FLOVENT DISKUS) 50 MCG/BLIST diskus inhaler Inhale 1 puff into the lungs 2 (two) times daily. Patient not taking: Reported on 08/04/2022 06/27/20   Bulah Alm GORMAN, PA-C  metFORMIN  (GLUCOPHAGE -XR) 500 MG 24 hr tablet TAKE 1 TABLET BY MOUTH EVERY DAY WITH BREAKFAST 08/12/23  Tysinger, Alm RAMAN, PA-C  methocarbamol (ROBAXIN) 500 MG tablet Take 500 mg by mouth 3 (three) times daily as needed. 07/25/22   [provider]  rosuvastatin  (CRESTOR ) 10 MG tablet Take 1 tablet (10 mg total) by mouth daily. 03/01/23 02/29/24  Tysinger, Alm RAMAN, PA-C  Semaglutide ,0.25 or 0.5MG /DOS, (OZEMPIC , 0.25 OR 0.5 MG/DOSE,) 2 MG/3ML SOPN INJECT 0.5 MG INTO THE SKIN ONE TIME PER WEEK 07/28/23   Tysinger, Alm RAMAN, PA-C    Family History Family History  Problem Relation Age of Onset   Diabetes Mother    Heart disease Father 57       MI    Prostatitis Father    Cancer Sister    Kidney disease Sister    Diabetes Sister    Diabetes Brother     Social History Social History   Tobacco Use   Smoking status: Never   Smokeless tobacco: Never  Vaping Use   Vaping status: Never Used  Substance Use Topics   Alcohol use: No   Drug use: No     Allergies   Patient has no known allergies.   Review of Systems Review of Systems  Constitutional:  Negative for chills and fever.  HENT:  Positive for congestion. Negative for ear pain and sore throat.   Respiratory:  Positive for cough, shortness of breath and wheezing.   Cardiovascular:  Negative for chest pain and palpitations.     Physical Exam Triage Vital Signs ED Triage Vitals  Encounter Vitals Group     BP 09/07/23 0936 114/74     Systolic BP Percentile --      Diastolic BP Percentile --      Pulse Rate 09/07/23 0936 (!) 103     Resp 09/07/23 0936 18     Temp 09/07/23 0936 99 F (37.2 C)     Temp src --      SpO2 09/07/23 0936 96 %     Weight --      Height --      Head Circumference --      Peak Flow --      Pain Score 09/07/23 0931 6     Pain Loc --      Pain Education --      Exclude from Growth Chart --    No data found.  Updated Vital Signs BP 114/74   Pulse (!) 103   Temp 99 F (37.2 C)   Resp 18   SpO2 96%   Visual Acuity Right Eye Distance:   Left Eye Distance:   Bilateral Distance:    Right Eye Near:   Left Eye Near:    Bilateral Near:     Physical Exam Constitutional:      General: He is not in acute distress. HENT:     Right Ear: Tympanic membrane normal.     Left Ear: Tympanic membrane normal.     Nose: Nose normal.     Mouth/Throat:     Mouth: Mucous membranes are moist.     Pharynx: Oropharynx is clear.  Cardiovascular:     Rate and Rhythm: Normal rate and regular rhythm.     Heart sounds: Normal heart sounds.  Pulmonary:     Effort: Pulmonary effort is normal. No respiratory distress.     Breath sounds: Normal  breath sounds. No wheezing.  Skin:    General: Skin is warm and dry.  Neurological:     Mental Status: He is alert.  UC Treatments / Results  Labs (all labs ordered are listed, but only abnormal results are displayed) Labs Reviewed - No data to display  EKG   Radiology No results found.  Procedures Procedures (including critical care time)  Medications Ordered in UC Medications - No data to display  Initial Impression / Assessment and Plan / UC Course  I have reviewed the triage vital signs and the nursing notes.  Pertinent labs & imaging results that were available during my care of the patient were reviewed by me and considered in my medical decision making (see chart for details).    Asthma exacerbation, shortness of breath.  No respiratory distress.  O2 sat is 96% on room air.  Lungs are clear at this time.  Treating with prednisone  and Zithromax .  Instructed patient to continue using his albuterol  inhaler which she states is in date and has plenty of activations.  Instructed him to follow-up with his PCP tomorrow.  ED precautions given.  Education provided on asthma and shortness of breath.  He agrees to plan of care.  Final Clinical Impressions(s) / UC Diagnoses   Final diagnoses:  Mild intermittent asthma with acute exacerbation  Shortness of breath     Discharge Instructions      Take the prednisone  and Zithromax  as directed.  Use your albuterol  inhaler as directed.  Follow-up with your primary care provider.  Go to the emergency department if you have worsening symptoms.     ED Prescriptions     Medication Sig Dispense Auth. Provider   azithromycin  (ZITHROMAX ) 250 MG tablet Take 1 tablet (250 mg total) by mouth daily. Take first 2 tablets together, then 1 every day until finished. 6 tablet Corlis Burnard DEL, NP   predniSONE  (DELTASONE ) 10 MG tablet Take 4 tablets (40 mg total) by mouth daily for 5 days. 20 tablet Corlis Burnard DEL, NP      PDMP not  reviewed this encounter.   Corlis Burnard DEL, NP 09/07/23 1000

## 2023-09-07 NOTE — Discharge Instructions (Addendum)
 Take the prednisone and Zithromax as directed.  Use your albuterol inhaler as directed.  Follow-up with your primary care provider.  Go to the emergency department if you have worsening symptoms.

## 2023-09-16 ENCOUNTER — Encounter: Payer: Federal, State, Local not specified - PPO | Admitting: Medical

## 2023-09-26 ENCOUNTER — Ambulatory Visit (INDEPENDENT_AMBULATORY_CARE_PROVIDER_SITE_OTHER): Payer: BC Managed Care – PPO | Admitting: Medical

## 2023-09-26 VITALS — BP 120/70 | HR 85 | Ht 71.25 in | Wt 233.8 lb

## 2023-09-26 DIAGNOSIS — Z125 Encounter for screening for malignant neoplasm of prostate: Secondary | ICD-10-CM | POA: Diagnosis not present

## 2023-09-26 DIAGNOSIS — Z2820 Immunization not carried out because of patient decision for unspecified reason: Secondary | ICD-10-CM | POA: Insufficient documentation

## 2023-09-26 DIAGNOSIS — E782 Mixed hyperlipidemia: Secondary | ICD-10-CM | POA: Diagnosis not present

## 2023-09-26 DIAGNOSIS — Z7185 Encounter for immunization safety counseling: Secondary | ICD-10-CM

## 2023-09-26 DIAGNOSIS — E785 Hyperlipidemia, unspecified: Secondary | ICD-10-CM

## 2023-09-26 DIAGNOSIS — L989 Disorder of the skin and subcutaneous tissue, unspecified: Secondary | ICD-10-CM | POA: Insufficient documentation

## 2023-09-26 DIAGNOSIS — E118 Type 2 diabetes mellitus with unspecified complications: Secondary | ICD-10-CM

## 2023-09-26 DIAGNOSIS — Z6833 Body mass index (BMI) 33.0-33.9, adult: Secondary | ICD-10-CM

## 2023-09-26 DIAGNOSIS — J4520 Mild intermittent asthma, uncomplicated: Secondary | ICD-10-CM

## 2023-09-26 DIAGNOSIS — Z0000 Encounter for general adult medical examination without abnormal findings: Secondary | ICD-10-CM

## 2023-09-26 DIAGNOSIS — E559 Vitamin D deficiency, unspecified: Secondary | ICD-10-CM | POA: Diagnosis not present

## 2023-09-26 LAB — LIPID PANEL

## 2023-09-26 MED ORDER — ALBUTEROL SULFATE HFA 108 (90 BASE) MCG/ACT IN AERS: INHALATION_SPRAY | RESPIRATORY_TRACT | 1 refills | Status: DC

## 2023-09-26 MED ORDER — CEPHALEXIN 500 MG PO CAPS: 500.0000 mg | ORAL_CAPSULE | Freq: Three times a day (TID) | ORAL | 0 refills | Status: DC

## 2023-09-26 NOTE — Assessment & Plan Note (Signed)
Congratulated him on weight loss from last visit.  Continue efforts to lose weight through healthy diet and exercise

## 2023-09-26 NOTE — Assessment & Plan Note (Signed)
Continue statin, updated labs today ?

## 2023-09-26 NOTE — Assessment & Plan Note (Signed)
No particular issues.  Uses albuterol as needed.  He is using his over-the-counter device that sounds like an incentive spirometer a feels like it helps his breathing overall.  Not having to use albuterol very often.

## 2023-09-26 NOTE — Progress Notes (Signed)
Complete physical exam  Patient: Mark Arroyo   DOB: November 18, 1970   53 y.o. Male  MRN: [id number]  Subjective:    Chief Complaint  Patient presents with   Annual Exam    Fasting cpe, ozempic- no change in diet, bump on left neck- popped up Thursday but hasn't gone better. When coughing or sneezing he feels a pulling sensation on left side at chest. Has eye appt today at [facility] in [place]    Mark Arroyo is a 53 y.o. male who presents today for a complete physical exam. He reports consuming a  improved comapred to prior, doesn't eat after 7pm, juicing, limiting bread .  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 Does peloton bike as well.  He generally feels well. He reports sleeping well.   Is taking OTC vitamin D supplement.  Diabetes - compliant wht Metfomrin XR 500mg  daily, ozempic 2mg  weekly.  Checks sugars some, but not in last few months since August 2024.   Fasting morning numbers typically ok.  Hyperlipdiemai - compliatnwi with crestor 10mg  without c/o.  Asthma - no recent problems.    Most recent fall risk assessment:    09/26/2023    9:33 AM  Fall Risk   Falls in the past year? 0  Number falls in past yr: 0  Injury with Fall? 0  Risk for fall due to : No Fall Risks  Follow up Falls evaluation completed     Most recent depression screenings:    09/26/2023    9:33 AM 02/28/2023   12:06 PM  PHQ 2/9 Scores  PHQ - 2 Score 0 0    Vision:sees dentist today.  and Dental: No current dental problems  Patient Active Problem List   Diagnosis Date Noted   Skin lesion 09/26/2023   Vaccine refused by patient 09/26/2023   Type II diabetes mellitus with complication (HCC) 02/28/2023   Mixed dyslipidemia 02/28/2023   Vitamin D deficiency 02/28/2023   Vaccine counseling 08/04/2022   BMI 33.0-33.9,adult 07/08/2021   Migraine 07/08/2021   Family history of premature CAD 02/23/2017   Screening for prostate cancer 02/23/2017   IBS  (irritable bowel syndrome) 02/23/2016   Rhinitis, allergic 02/23/2016   Encounter for health maintenance examination in adult 02/23/2016   Asthma, mild intermittent 01/15/2015   Past Medical History:  Diagnosis Date   Asthma    GERD (gastroesophageal reflux disease)    intermittent   Migraines    as adult, age 55, usually stress related, no frequent headaches since 04/2014   Seasonal allergic rhinitis    Wears glasses    Past Surgical History:  Procedure Laterality Date   ANTERIOR CERVICAL DECOMP/DISCECTOMY FUSION  09/07/2011   Dr. Yevette Edwards   COLONOSCOPY  43   due to stomach issues, normal, [place]   INGUINAL HERNIA REPAIR  09/06/2001   right   MEDIAL COLLATERAL LIGAMENT REPAIR, KNEE  09/06/2009   left   NASAL SINUS SURGERY  06/25/2015   x2,    SHOULDER SURGERY Left 08/06/2013   left, labrum repair, RTC repair, spur removal; [place]   VASECTOMY  09/07/2011   Social History   Tobacco Use   Smoking status: Never   Smokeless tobacco: Never  Vaping Use   Vaping status: Never Used  Substance Use Topics   Alcohol use: No   Drug use: No   Family History  Problem Relation Age of Onset   Diabetes Mother    Heart disease  Father 29       MI   Prostatitis Father    Cancer Sister    Kidney disease Sister    Diabetes Sister    Diabetes Brother    No Known Allergies    Patient Care Team: Shelbi Vaccaro, Kermit Balo, PA-C as PCP - General (Family Medicine) Gaylord Shih, [place] (Specialist) Jeani Hawking, MD as Consulting Physician (Gastroenterology)  [facility] care  Outpatient Medications Prior to Visit  Medication Sig   Azelastine HCl 137 MCG/SPRAY SOLN Place 2 sprays into both nostrils 2 (two) times daily.   metFORMIN (GLUCOPHAGE-XR) 500 MG 24 hr tablet TAKE 1 TABLET BY MOUTH EVERY DAY WITH BREAKFAST   rosuvastatin (CRESTOR) 10 MG tablet Take 1 tablet (10 mg total) by mouth daily.   Semaglutide,0.25 or 0.5MG /DOS, (OZEMPIC, 0.25 OR 0.5 MG/DOSE,) 2 MG/3ML SOPN INJECT 0.5 MG  INTO THE SKIN ONE TIME PER WEEK   [DISCONTINUED] albuterol (VENTOLIN HFA) 108 (90 Base) MCG/ACT inhaler INHALE 2 PUFFS BY MOUTH EVERY 6 HOURS AS NEEDED FOR WHEEZING OR SHORTNESS OF BREATH   [DISCONTINUED] Cholecalciferol (VITAMIN D) 125 MCG (5000 UT) CAPS Take 1 tablet by mouth daily.   [DISCONTINUED] azithromycin (ZITHROMAX) 250 MG tablet Take 1 tablet (250 mg total) by mouth daily. Take first 2 tablets together, then 1 every day until finished.   [DISCONTINUED] fluticasone (FLOVENT DISKUS) 50 MCG/BLIST diskus inhaler Inhale 1 puff into the lungs 2 (two) times daily. (Patient not taking: Reported on 08/04/2022)   [DISCONTINUED] methocarbamol (ROBAXIN) 500 MG tablet Take 500 mg by mouth 3 (three) times daily as needed.   Facility-Administered Medications Prior to Visit  Medication Dose Route Frequency Provider   gadopentetate dimeglumine (MAGNEVIST) injection 20 mL  20 mL Intravenous Once PRN Penumalli, Glenford Bayley, MD    Review of Systems  Constitutional:  Negative for chills, fever, malaise/fatigue and weight loss.  HENT:  Negative for congestion, ear pain, hearing loss, sore throat and tinnitus.   Eyes:  Negative for blurred vision, pain and redness.  Respiratory:  Positive for shortness of breath. Negative for cough and hemoptysis.        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  Gastrointestinal:  Negative for abdominal pain, blood in stool, constipation, diarrhea, nausea and vomiting.  Genitourinary:  Negative for dysuria, flank pain, frequency, hematuria and urgency.  Musculoskeletal:  Negative for falls, joint pain and myalgias.  Skin:  Negative for itching and rash.       New skin lesion on left neck that just popped up few days ago.  It is somewhat painful.  Neurological:  Negative for  dizziness, tingling, speech change, weakness and headaches.  Endo/Heme/Allergies:  Negative for polydipsia. Does not bruise/bleed easily.  Psychiatric/Behavioral:  Negative for depression and memory loss. The patient is not nervous/anxious and does not have insomnia.          Objective:     BP 120/70   Pulse 85   Ht 5' 11.25" (1.81 m)   Wt 233 lb 12.8 oz (106.1 kg)   SpO2 98%   BMI 32.38 kg/m  BP Readings from Last 3 Encounters:  09/26/23 120/70  09/07/23 114/74  02/28/23 138/88   Wt Readings from Last 3 Encounters:  09/26/23 233 lb 12.8 oz (106.1 kg)  02/28/23 247 lb (112 kg)  08/04/22  237 lb (107.5 kg)     Physical Exam Vitals and nursing note reviewed.  Constitutional:      General: He is not in acute distress.    Appearance: Normal appearance. He is not ill-appearing.  HENT:     Head: Normocephalic and atraumatic.     Right Ear: External ear normal.     Left Ear: External ear normal.     Nose: Nose normal.     Mouth/Throat:     Mouth: Mucous membranes are moist.     Pharynx: Oropharynx is clear.  Eyes:     Extraocular Movements: Extraocular movements intact.     Conjunctiva/sclera: Conjunctivae normal.     Pupils: Pupils are equal, round, and reactive to light.  Neck:     Vascular: No carotid bruit.  Cardiovascular:     Rate and Rhythm: Normal rate and regular rhythm.     Pulses: Normal pulses.     Heart sounds: Normal heart sounds.  Pulmonary:     Effort: Pulmonary effort is normal.     Breath sounds: Normal breath sounds.  Abdominal:     General: Bowel sounds are normal. There is no distension.     Palpations: Abdomen is soft. There is no mass.     Tenderness: There is no abdominal tenderness.     Hernia: No hernia is present.  Genitourinary:    Comments: declined GU and rectal Musculoskeletal:        General: No swelling, tenderness or deformity. Normal range of motion.     Cervical back: Normal range of motion and neck supple. No tenderness.      Right lower leg: No edema.     Left lower leg: No edema.  Lymphadenopathy:     Cervical: No cervical adenopathy.  Skin:    General: Skin is warm and dry.     Capillary Refill: Capillary refill takes less than 2 seconds.     Findings: Lesion present.     Comments: Left lateral neck with 1 cm diameter raised papular lesion with an ulcerated center that he did scratch it.  No obvious fluctuance or warmth.  Somewhat firm or indurated  Neurological:     General: No focal deficit present.     Mental Status: He is alert and oriented to person, place, and time. Mental status is at baseline.     Cranial Nerves: No cranial nerve deficit.     Sensory: No sensory deficit.     Motor: No weakness.     Gait: Gait normal.     Deep Tendon Reflexes: Reflexes normal.  Psychiatric:        Mood and Affect: Mood normal.        Behavior: Behavior normal.        Judgment: Judgment normal.         Assessment & Plan:    Routine Health Maintenance and Physical Exam  Immunization History  Administered Date(s) Administered   Pneumococcal Conjugate-13 07/08/2021   Pneumococcal Polysaccharide-23 12/12/2013, 01/15/2015   Td 09/06/2005, 02/04/2013   Tdap 10/26/2011, 02/28/2023    Health Maintenance  Topic Date Due   OPHTHALMOLOGY EXAM  Never done   HEMOGLOBIN A1C  08/30/2023   INFLUENZA VACCINE  12/05/2023 (Originally 04/07/2023)   Zoster Vaccines- Shingrix (1 of 2) 12/25/2023 (Originally 04/17/2022)   Diabetic kidney evaluation - eGFR measurement  02/28/2024   Diabetic kidney evaluation - Urine ACR  02/28/2024   FOOT EXAM  02/28/2024   Colonoscopy  11/20/2031  DTaP/Tdap/Td (5 - Td or Tdap) 02/27/2033   Pneumococcal Vaccine 59-24 Years old (3 of 3 - PPSV23 or PCV20) 04/17/2037   HPV VACCINES  Aged Out   COVID-19 Vaccine  Discontinued   Hepatitis C Screening  Discontinued   HIV Screening  Discontinued    Discussed health benefits of physical activity, and encouraged him to engage in regular  exercise appropriate for his age and condition.  Problem List Items Addressed This Visit     Asthma, mild intermittent   No particular issues.  Uses albuterol as needed.  He is using his over-the-counter device that sounds like an incentive spirometer a feels like it helps his breathing overall.  Not having to use albuterol very often.      Relevant Medications   albuterol (VENTOLIN HFA) 108 (90 Base) MCG/ACT inhaler   Encounter for health maintenance examination in adult - Primary   Relevant Orders   Comprehensive metabolic panel   CBC   Lipid panel   Hemoglobin A1c   PSA   Vitamin D, 25-hydroxy   Screening for prostate cancer   Relevant Orders   PSA   BMI 33.0-33.9,adult   Congratulated him on weight loss from last visit.  Continue efforts to lose weight through healthy diet and exercise      RESOLVED: Hyperlipidemia   Vaccine counseling   Type II diabetes mellitus with complication (HCC)   Updated labs today The plan will be to increase Ozempic dose once we get labs back.  Continue metformin.   Continue glucose monitoring See your eye doctor yearly make sure they send [place] a copy of your diabetic eye exam       Relevant Orders   Hemoglobin A1c   Mixed dyslipidemia   Continue statin, updated labs today      Relevant Orders   Lipid panel   Vitamin D deficiency   Relevant Orders   Vitamin D, 25-hydroxy   Skin lesion   Advised warm compresses, keep clean with soap and water.  Watch for any changes of the next week.  If worsening then recheck.  If not resolved completely within 10 days then recheck      Vaccine refused by patient   Counseled on the shingles vaccine today.  He declines vaccines today      Return 4-6 months on diabetes.     Kristian Covey, PA-C

## 2023-09-26 NOTE — Assessment & Plan Note (Signed)
Counseled on the shingles vaccine today.  He declines vaccines today

## 2023-09-26 NOTE — Assessment & Plan Note (Signed)
Updated labs today The plan will be to increase Ozempic dose once we get labs back.  Continue metformin.   Continue glucose monitoring See your eye doctor yearly make sure they send [place] a copy of your diabetic eye exam

## 2023-09-26 NOTE — Assessment & Plan Note (Signed)
Advised warm compresses, keep clean with soap and water.  Watch for any changes of the next week.  If worsening then recheck.  If not resolved completely within 10 days then recheck

## 2023-09-27 ENCOUNTER — Other Ambulatory Visit: Payer: Self-pay | Admitting: Medical

## 2023-09-27 LAB — COMPREHENSIVE METABOLIC PANEL
ALT: 26 IU/L (ref 0–44)
AST: 21 IU/L (ref 0–40)
Albumin: 4.3 g/dL (ref 3.8–4.9)
Alkaline Phosphatase: 82 IU/L (ref 44–121)
BUN/Creatinine Ratio: 9 (ref 9–20)
BUN: 9 mg/dL (ref 6–24)
Bilirubin Total: 1 mg/dL (ref 0.0–1.2)
CO2: 27 mmol/L (ref 20–29)
Calcium: 9.6 mg/dL (ref 8.7–10.2)
Chloride: 99 mmol/L (ref 96–106)
Creatinine, Ser: 1.05 mg/dL (ref 0.76–1.27)
Globulin, Total: 2.7 g/dL (ref 1.5–4.5)
Glucose: 118 mg/dL — ABNORMAL HIGH (ref 70–99)
Potassium: 4.2 mmol/L (ref 3.5–5.2)
Sodium: 140 mmol/L (ref 134–144)
Total Protein: 7 g/dL (ref 6.0–8.5)
eGFR: 86 mL/min/{1.73_m2} (ref >59)

## 2023-09-27 LAB — LIPID PANEL
Cholesterol, Total: 214 mg/dL — ABNORMAL HIGH (ref 100–199)
HDL: 56 mg/dL (ref >39)
LDL CALC COMMENT:: 3.8 ratio (ref 0.0–5.0)
LDL Chol Calc (NIH): 142 mg/dL — ABNORMAL HIGH (ref 0–99)
Triglycerides: 91 mg/dL (ref 0–149)
VLDL Cholesterol Cal: 16 mg/dL (ref 5–40)

## 2023-09-27 LAB — CBC
Hematocrit: 44.3 % (ref 37.5–51.0)
Hemoglobin: 14 g/dL (ref 13.0–17.7)
MCH: 26.2 pg — ABNORMAL LOW (ref 26.6–33.0)
MCHC: 31.6 g/dL (ref 31.5–35.7)
MCV: 83 fL (ref 79–97)
Platelets: 223 10*3/uL (ref 150–450)
RBC: 5.35 x10E6/uL (ref 4.14–5.80)
RDW: 13.6 % (ref 11.6–15.4)
WBC: 5 10*3/uL (ref 3.4–10.8)

## 2023-09-27 LAB — PSA: Prostate Specific Ag, Serum: 1.4 ng/mL (ref 0.0–4.0)

## 2023-09-27 LAB — HEMOGLOBIN A1C
Est. average glucose Bld gHb Est-mCnc: 137 mg/dL
Hgb A1c MFr Bld: 6.4 % — ABNORMAL HIGH (ref 4.8–5.6)

## 2023-09-27 LAB — VITAMIN D 25 HYDROXY (VIT D DEFICIENCY, FRACTURES): Vit D, 25-Hydroxy: 7.3 ng/mL — ABNORMAL LOW (ref 30.0–100.0)

## 2023-09-27 MED ORDER — ROSUVASTATIN CALCIUM 20 MG PO TABS: 20.0000 mg | ORAL_TABLET | Freq: Every day | ORAL | 2 refills | Status: DC

## 2023-09-27 MED ORDER — VITAMIN D 50 MCG (2000 UT) PO CAPS: 1.0000 | ORAL_CAPSULE | Freq: Every day | ORAL | 1 refills | Status: DC

## 2023-09-27 MED ORDER — SEMAGLUTIDE (1 MG/DOSE) 4 MG/3ML ~~LOC~~ SOPN: 1.0000 mg | PEN_INJECTOR | SUBCUTANEOUS | 0 refills | Status: DC

## 2023-09-27 MED ORDER — OZEMPIC (2 MG/DOSE) 8 MG/3ML ~~LOC~~ SOPN: 2.0000 mg | PEN_INJECTOR | SUBCUTANEOUS | 1 refills | Status: DC

## 2023-09-27 NOTE — Progress Notes (Signed)
Schedule 3 month recheck med check visit fasting  Results sent through MyChart

## 2023-09-29 ENCOUNTER — Other Ambulatory Visit: Payer: Self-pay | Admitting: Medical

## 2023-11-18 ENCOUNTER — Other Ambulatory Visit: Payer: Self-pay | Admitting: Medical

## 2024-01-10 ENCOUNTER — Other Ambulatory Visit: Payer: Self-pay | Admitting: Medical

## 2024-02-09 DIAGNOSIS — M898X1 Other specified disorders of bone, shoulder: Secondary | ICD-10-CM | POA: Diagnosis not present

## 2024-03-02 ENCOUNTER — Other Ambulatory Visit: Payer: Self-pay | Admitting: Medical

## 2024-03-13 ENCOUNTER — Ambulatory Visit: Admitting: Medical

## 2024-03-13 VITALS — BP 108/86 | HR 69 | Temp 98.3°F | Wt 234.0 lb

## 2024-03-13 DIAGNOSIS — E782 Mixed hyperlipidemia: Secondary | ICD-10-CM

## 2024-03-13 DIAGNOSIS — J329 Chronic sinusitis, unspecified: Secondary | ICD-10-CM | POA: Diagnosis not present

## 2024-03-13 DIAGNOSIS — S022XXA Fracture of nasal bones, initial encounter for closed fracture: Secondary | ICD-10-CM | POA: Diagnosis not present

## 2024-03-13 DIAGNOSIS — E118 Type 2 diabetes mellitus with unspecified complications: Secondary | ICD-10-CM

## 2024-03-13 DIAGNOSIS — J328 Other chronic sinusitis: Secondary | ICD-10-CM | POA: Diagnosis not present

## 2024-03-13 DIAGNOSIS — J339 Nasal polyp, unspecified: Secondary | ICD-10-CM

## 2024-03-13 DIAGNOSIS — J323 Chronic sphenoidal sinusitis: Secondary | ICD-10-CM | POA: Diagnosis not present

## 2024-03-13 DIAGNOSIS — J324 Chronic pansinusitis: Secondary | ICD-10-CM | POA: Diagnosis not present

## 2024-03-13 NOTE — Progress Notes (Signed)
 Subjective:  Mark Arroyo is a 53 y.o. male who presents for Chief Complaint  Patient presents with   Acute Visit    Nasal issues- feels like something is stuck in nose. Blowing out mucous or when breathing he feels something moving. Been going on a couple weeks     Here for concerns about sinus issues.   Saw ENT recently.  ENT wanting him to go on prednisone .  However, ENT wanted him to check with PCP first about elevated sugars and whether he can take prednisone .  Been dealing with lots of sinus pressure, pressure behind eyes, lots of mucous.   Is using sinus rinses.  Doesn't like most nasal sprays.  Prior to treatment ENT did today, he has been miserable the last 2 weeks with stuffed up nose.  He has tried various other treatments and nasal sprays in the past.  He does not tolerate many of the nasal sprays  He is on metformin  XR 500 mg once daily.  He was on Ozempic  leading up to January 2025 but insurance would not pay for this anymore  He is compliant with his cholesterol medications since last visit  He is fasting today  No other aggravating or relieving factors.    No other c/o.  Past Medical History:  Diagnosis Date   Asthma    GERD (gastroesophageal reflux disease)    intermittent   Migraines    as adult, age 74, usually stress related, no frequent headaches since 04/2014   Seasonal allergic rhinitis    Wears glasses    Current Outpatient Medications on File Prior to Visit  Medication Sig Dispense Refill   albuterol  (VENTOLIN  HFA) 108 (90 Base) MCG/ACT inhaler INHALE 2 PUFFS BY MOUTH EVERY 6 HOURS AS NEEDED FOR WHEEZING OR SHORTNESS OF BREATH 18 each 1   Azelastine HCl 137 MCG/SPRAY SOLN Place 2 sprays into both nostrils 2 (two) times daily.     Cholecalciferol (VITAMIN D ) 50 MCG (2000 UT) CAPS Take 1 capsule (2,000 Units total) by mouth daily. 90 capsule 1   metFORMIN  (GLUCOPHAGE -XR) 500 MG 24 hr tablet TAKE 1 TABLET BY MOUTH EVERY DAY WITH BREAKFAST 90 tablet 0    rosuvastatin  (CRESTOR ) 20 MG tablet Take 1 tablet (20 mg total) by mouth daily. 90 tablet 2   Current Facility-Administered Medications on File Prior to Visit  Medication Dose Route Frequency Provider Last Rate Last Admin   gadopentetate dimeglumine  (MAGNEVIST ) injection 20 mL  20 mL Intravenous Once PRN Penumalli, Vikram R, MD         The following portions of the patient's history were reviewed and updated as appropriate: allergies, current medications, past family history, past medical history, past social history, past surgical history and problem list.  ROS Otherwise as in subjective above    Objective: BP 108/86   Pulse 69   Temp 98.3 F (36.8 C)   Wt 234 lb (106.1 kg)   SpO2 97%   BMI 32.41 kg/m   General appearance: alert, no distress, well developed, well nourished HEENT: normocephalic, sclerae anicteric, conjunctiva pink and moist, TMs pearly, nares with some turbinate edema, some thicker nasal discharge , no significant erythema, pharynx normal Oral cavity: MMM, no lesions Neck: supple, no lymphadenopathy, no thyromegaly, no masses Heart: RRR, normal S1, S2, no murmurs Lungs: CTA bilaterally, no wheezes, rhonchi, or rales    Assessment: Encounter Diagnoses  Name Primary?   Type II diabetes mellitus with complication (HCC) Yes   Mixed dyslipidemia  Nasal polyp    Chronic sinusitis, unspecified location      Plan: Discussed concerns, symptoms.  Labs as below  Continue Metformin .  Pending labs, will likely add Mounjaro  to replace ozempic . Was last on ozempic  09/2023.  Monitor glucose at home.  Given prescriptions for glucometer and freestyle herlene today dependent which was covered by insurance he will start 1.  We discussed glucose monitoring.  If he ends up using the prednisone  per ENT for the chronic sinus and nasal polyp issue we will likely need to add some mealtime insulin  for the next 2 to 3 weeks  Dyslipidemia - labs today.   Continue Crestor  20mg   daily, started last visit, 09/2023  Nasal polyps, chronic sinusitis-pending labs the plan will be to approve ENT to start prednisone  along with him doing some short-term mealtime insulin  to counteract the elevated blood sugars that will occur if he is on prednisone   Jaelon was seen today for acute visit.  Diagnoses and all orders for this visit:  Type II diabetes mellitus with complication (HCC) -     Comprehensive metabolic panel with GFR -     Lipid panel -     Hemoglobin A1c  Mixed dyslipidemia -     Comprehensive metabolic panel with GFR -     Lipid panel  Nasal polyp  Chronic sinusitis, unspecified location    Follow up: pending labs

## 2024-03-14 ENCOUNTER — Other Ambulatory Visit: Payer: Self-pay | Admitting: Medical

## 2024-03-14 ENCOUNTER — Telehealth: Payer: Self-pay

## 2024-03-14 ENCOUNTER — Ambulatory Visit: Payer: Self-pay | Admitting: Medical

## 2024-03-14 ENCOUNTER — Other Ambulatory Visit (HOSPITAL_COMMUNITY): Payer: Self-pay

## 2024-03-14 LAB — COMPREHENSIVE METABOLIC PANEL WITH GFR
ALT: 15 IU/L (ref 0–44)
AST: 18 IU/L (ref 0–40)
Albumin: 4.6 g/dL (ref 3.8–4.9)
Alkaline Phosphatase: 74 IU/L (ref 44–121)
BUN/Creatinine Ratio: 9 (ref 9–20)
BUN: 8 mg/dL (ref 6–24)
Bilirubin Total: 0.8 mg/dL (ref 0.0–1.2)
CO2: 18 mmol/L — ABNORMAL LOW (ref 20–29)
Calcium: 9.7 mg/dL (ref 8.7–10.2)
Chloride: 101 mmol/L (ref 96–106)
Creatinine, Ser: 0.89 mg/dL (ref 0.76–1.27)
Globulin, Total: 2.7 g/dL (ref 1.5–4.5)
Glucose: 92 mg/dL (ref 70–99)
Potassium: 4.3 mmol/L (ref 3.5–5.2)
Sodium: 141 mmol/L (ref 134–144)
Total Protein: 7.3 g/dL (ref 6.0–8.5)
eGFR: 104 mL/min/1.73 (ref >59)

## 2024-03-14 LAB — LIPID PANEL
Chol/HDL Ratio: 4.6 ratio (ref 0.0–5.0)
Cholesterol, Total: 202 mg/dL — ABNORMAL HIGH (ref 100–199)
HDL: 44 mg/dL (ref >39)
LDL Chol Calc (NIH): 139 mg/dL — ABNORMAL HIGH (ref 0–99)
Triglycerides: 106 mg/dL (ref 0–149)
VLDL Cholesterol Cal: 19 mg/dL (ref 5–40)

## 2024-03-14 LAB — HEMOGLOBIN A1C
Est. average glucose Bld gHb Est-mCnc: 140 mg/dL
Hgb A1c MFr Bld: 6.5 % — ABNORMAL HIGH (ref 4.8–5.6)

## 2024-03-14 MED ORDER — BD PEN NEEDLE NANO U/F 32G X 4 MM MISC: 1.0000 | Freq: Every day | 2 refills | Status: DC

## 2024-03-14 MED ORDER — METFORMIN HCL ER 500 MG PO TB24: 500.0000 mg | ORAL_TABLET | Freq: Every day | ORAL | 0 refills | Status: DC

## 2024-03-14 MED ORDER — VITAMIN D 50 MCG (2000 UT) PO CAPS: 1.0000 | ORAL_CAPSULE | Freq: Every day | ORAL | 1 refills | Status: DC

## 2024-03-14 MED ORDER — MOUNJARO 5 MG/0.5ML ~~LOC~~ SOAJ: 5.0000 mg | SUBCUTANEOUS | 0 refills | Status: DC

## 2024-03-14 MED ORDER — INSULIN ASPART 100 UNIT/ML FLEXPEN: 5.0000 [IU] | PEN_INJECTOR | Freq: Three times a day (TID) | SUBCUTANEOUS | 2 refills | Status: DC

## 2024-03-14 MED ORDER — MOUNJARO 2.5 MG/0.5ML ~~LOC~~ SOAJ: 2.5000 mg | SUBCUTANEOUS | 0 refills | Status: DC

## 2024-03-14 NOTE — Progress Notes (Signed)
 Results sent through MyChart

## 2024-03-14 NOTE — Telephone Encounter (Signed)
 Pharmacy Patient Advocate Encounter   Received notification from CoverMyMeds that prior authorization for Mounjaro  2.5MG /0.5ML auto-injectors is required/requested.   Insurance verification completed.   The patient is insured through [facility] .   Per test claim: PA required; PA submitted to above mentioned insurance via CoverMyMeds Key/confirmation #/EOC (Key: [id number])   Status is pending

## 2024-03-14 NOTE — Progress Notes (Signed)
 Notes & messages sent through MyChart

## 2024-03-15 ENCOUNTER — Other Ambulatory Visit: Payer: Self-pay | Admitting: Medical

## 2024-03-15 ENCOUNTER — Other Ambulatory Visit (HOSPITAL_COMMUNITY): Payer: Self-pay

## 2024-03-15 MED ORDER — EZETIMIBE 10 MG PO TABS: 10.0000 mg | ORAL_TABLET | Freq: Every day | ORAL | 0 refills | Status: DC

## 2024-03-15 NOTE — Telephone Encounter (Signed)
 Pharmacy Patient Advocate Encounter  Received notification from [facility] that Prior Authorization for Mounjaro  2.5MG /0.5ML auto-injectors has been APPROVED to 7.9.26. Ran test claim, Copay is $200.00. This test claim was processed through [facility]- copay amounts may vary at other pharmacies due to pharmacy/plan contracts, or as the patient moves through the different stages of their insurance plan.   PA #/Case ID/Reference #: (Key: [id number])

## 2024-03-26 ENCOUNTER — Ambulatory Visit: Payer: BC Managed Care – PPO | Admitting: Medical

## 2024-04-12 ENCOUNTER — Ambulatory Visit: Admitting: Medical

## 2024-04-27 ENCOUNTER — Encounter: Payer: Self-pay | Admitting: Medical

## 2024-04-27 ENCOUNTER — Ambulatory Visit: Admitting: Medical

## 2024-04-27 VITALS — BP 120/78 | HR 83 | Wt 225.6 lb

## 2024-04-27 DIAGNOSIS — E782 Mixed hyperlipidemia: Secondary | ICD-10-CM

## 2024-04-27 DIAGNOSIS — E118 Type 2 diabetes mellitus with unspecified complications: Secondary | ICD-10-CM | POA: Diagnosis not present

## 2024-04-27 DIAGNOSIS — E559 Vitamin D deficiency, unspecified: Secondary | ICD-10-CM

## 2024-04-27 NOTE — Patient Instructions (Signed)
 Check insurance coverage to see what is their preferred medicaiton if you are interested.  Options include:  Ozempic  weekly injection  Mounjaro  weekly injection Trulicity weekly injection Rybelsus  oral tablet Victoza daily injection

## 2024-04-27 NOTE — Progress Notes (Signed)
 Subjective:  Mark Arroyo is a 53 y.o. male who presents for Chief Complaint  Patient presents with   Follow-up    6 month diabetes follow up.    Here for med check.  I saw him about a month ago regarding medications.  At that time his lipids were not at goal.  He was continued on Crestor  20 mg daily we added Zetia .  He is taking both medications and not having any particular side effects.  However since last visit he has made significant changes with his diet and exercise.  His wife had a heart attack in June 2025.  As a household they are eating healthier, healthier choices, less calories.  Requan is currently eating once daily.  He is doing fasting.  He has cut out a lot of junk and is exercising with jogging or running 5 to 6 miles most days.  He has been able to lose weight since last visit  He continues on metformin  XR 500 mg daily.  Last visit we were going to initiate Mounjaro  but it was going to be too expensive over $200 per month out of pocket.  He was on Ozempic  earlier this year until insurance would not cover this.  He is compliant with vitamin D  supplement as well.  No other aggravating or relieving factors.    No other c/o.  Past Medical History:  Diagnosis Date   Asthma    Diabetes mellitus type 2 with complications (HCC)    GERD (gastroesophageal reflux disease)    intermittent   Migraines    as adult, age 87, usually stress related, no frequent headaches since 04/2014   Mixed dyslipidemia    Seasonal allergic rhinitis    Vitamin D  deficiency    Wears glasses    Current Outpatient Medications on File Prior to Visit  Medication Sig Dispense Refill   albuterol  (VENTOLIN  HFA) 108 (90 Base) MCG/ACT inhaler INHALE 2 PUFFS BY MOUTH EVERY 6 HOURS AS NEEDED FOR WHEEZING OR SHORTNESS OF BREATH 18 each 1   Azelastine HCl 137 MCG/SPRAY SOLN Place 2 sprays into both nostrils 2 (two) times daily.     Cholecalciferol (VITAMIN D ) 50 MCG (2000 UT) CAPS Take 1 capsule (2,000  Units total) by mouth daily. 90 capsule 1   ezetimibe  (ZETIA ) 10 MG tablet Take 1 tablet (10 mg total) by mouth daily. 90 tablet 0   metFORMIN  (GLUCOPHAGE -XR) 500 MG 24 hr tablet Take 1 tablet (500 mg total) by mouth daily with breakfast. 90 tablet 0   rosuvastatin  (CRESTOR ) 20 MG tablet Take 1 tablet (20 mg total) by mouth daily. 90 tablet 2   insulin  aspart (NOVOLOG ) 100 UNIT/ML FlexPen Inject 5 Units into the skin 3 (three) times daily with meals. (Patient not taking: Reported on 04/27/2024) 15 mL 2   Insulin  Pen Needle (BD PEN NEEDLE NANO U/F) 32G X 4 MM MISC 1 each by Does not apply route at bedtime. (Patient not taking: Reported on 04/27/2024) 100 each 2   tirzepatide  (MOUNJARO ) 2.5 MG/0.5ML Pen Inject 2.5 mg into the skin once a week. (Patient not taking: Reported on 04/27/2024) 2 mL 0   tirzepatide  (MOUNJARO ) 5 MG/0.5ML Pen Inject 5 mg into the skin once a week. (Patient not taking: Reported on 04/27/2024) 2 mL 0   Current Facility-Administered Medications on File Prior to Visit  Medication Dose Route Frequency Provider Last Rate Last Admin   gadopentetate dimeglumine  (MAGNEVIST ) injection 20 mL  20 mL Intravenous Once PRN Penumalli, Vikram  R, MD         The following portions of the patient's history were reviewed and updated as appropriate: allergies, current medications, past family history, past medical history, past social history, past surgical history and problem list.  ROS Otherwise as in subjective above    Objective: BP 120/78   Pulse 83   Wt 225 lb 9.6 oz (102.3 kg)   SpO2 98%   BMI 31.24 kg/m   Wt Readings from Last 3 Encounters:  04/27/24 225 lb 9.6 oz (102.3 kg)  03/13/24 234 lb (106.1 kg)  09/26/23 233 lb 12.8 oz (106.1 kg)   BP Readings from Last 3 Encounters:  04/27/24 120/78  03/13/24 108/86  09/26/23 120/70    General appearance: alert, no distress, well developed, well nourished     Assessment: Encounter Diagnoses  Name Primary?   Type II  diabetes mellitus with complication (HCC) Yes   Vitamin D  deficiency    Mixed dyslipidemia       Plan: Diabetes type 2 Unfortunately Ozempic  and Mounjaro  have been too expensive.  He does not want to pursue any of the GLP1 or similar medication at this time Continue metformin  XR 500 mg daily continue with healthy eating habits and exercise Not currently on insulin  Continue glucose monitoring  Vitamin D  deficiency-continue vitamin D  supplement 2000 units daily  Mixed dyslipidemia-continue rosuvastatin  Crestor  20 mg and newly added Zetia  10 mg daily.  Return fasting for lab in about 3 to 4 weeks.  We discussed that since he has made significant lifestyle changes since last visit we may also go back to just Crestor  without Zetia .  Await labs.   Ardith was seen today for follow-up.  Diagnoses and all orders for this visit:  Type II diabetes mellitus with complication (HCC) -     Microalbumin/Creatinine Ratio, Urine; Future  Vitamin D  deficiency  Mixed dyslipidemia -     Lipid panel; Future -     ALT; Future   Follow up: pending labs

## 2024-05-03 ENCOUNTER — Other Ambulatory Visit: Payer: Self-pay | Admitting: Medical

## 2024-07-03 ENCOUNTER — Other Ambulatory Visit: Payer: Self-pay | Admitting: Medical

## 2024-09-02 ENCOUNTER — Other Ambulatory Visit: Payer: Self-pay | Admitting: Medical

## 2024-09-25 ENCOUNTER — Ambulatory Visit: Payer: BC Managed Care – PPO | Admitting: Medical

## 2024-09-25 ENCOUNTER — Encounter: Payer: Self-pay | Admitting: *Deleted

## 2024-09-25 ENCOUNTER — Encounter: Payer: Self-pay | Admitting: Cardiovascular Disease

## 2024-09-25 ENCOUNTER — Ambulatory Visit: Attending: Cardiovascular Disease | Admitting: Cardiovascular Disease

## 2024-09-25 ENCOUNTER — Encounter: Payer: Self-pay | Admitting: Medical

## 2024-09-25 VITALS — BP 112/70 | HR 81 | Ht 71.0 in | Wt 237.4 lb

## 2024-09-25 VITALS — BP 110/72 | HR 99 | Ht 71.0 in | Wt 235.4 lb

## 2024-09-25 DIAGNOSIS — M25512 Pain in left shoulder: Secondary | ICD-10-CM

## 2024-09-25 DIAGNOSIS — Z136 Encounter for screening for cardiovascular disorders: Secondary | ICD-10-CM

## 2024-09-25 DIAGNOSIS — Z0000 Encounter for general adult medical examination without abnormal findings: Secondary | ICD-10-CM

## 2024-09-25 DIAGNOSIS — I2089 Other forms of angina pectoris: Secondary | ICD-10-CM

## 2024-09-25 DIAGNOSIS — J4520 Mild intermittent asthma, uncomplicated: Secondary | ICD-10-CM

## 2024-09-25 DIAGNOSIS — Z2820 Immunization not carried out because of patient decision for unspecified reason: Secondary | ICD-10-CM

## 2024-09-25 DIAGNOSIS — E118 Type 2 diabetes mellitus with unspecified complications: Secondary | ICD-10-CM | POA: Diagnosis not present

## 2024-09-25 DIAGNOSIS — E782 Mixed hyperlipidemia: Secondary | ICD-10-CM | POA: Diagnosis not present

## 2024-09-25 DIAGNOSIS — E559 Vitamin D deficiency, unspecified: Secondary | ICD-10-CM | POA: Diagnosis not present

## 2024-09-25 DIAGNOSIS — R0602 Shortness of breath: Secondary | ICD-10-CM | POA: Diagnosis not present

## 2024-09-25 DIAGNOSIS — Z8249 Family history of ischemic heart disease and other diseases of the circulatory system: Secondary | ICD-10-CM | POA: Diagnosis not present

## 2024-09-25 DIAGNOSIS — Z125 Encounter for screening for malignant neoplasm of prostate: Secondary | ICD-10-CM | POA: Diagnosis not present

## 2024-09-25 MED ORDER — ALBUTEROL SULFATE HFA 108 (90 BASE) MCG/ACT IN AERS: INHALATION_SPRAY | RESPIRATORY_TRACT | 1 refills | Status: AC

## 2024-09-25 MED ORDER — METFORMIN HCL ER 500 MG PO TB24: 500.0000 mg | ORAL_TABLET | Freq: Every day | ORAL | 3 refills | Status: AC

## 2024-09-25 MED ORDER — EZETIMIBE 10 MG PO TABS: 10.0000 mg | ORAL_TABLET | Freq: Every day | ORAL | 3 refills | Status: AC

## 2024-09-25 MED ORDER — ROSUVASTATIN CALCIUM 20 MG PO TABS: 20.0000 mg | ORAL_TABLET | Freq: Every day | ORAL | 3 refills | Status: AC

## 2024-09-25 MED ORDER — METOPROLOL TARTRATE 100 MG PO TABS: ORAL_TABLET | ORAL | 0 refills | Status: AC

## 2024-09-25 MED ORDER — TIRZEPATIDE 2.5 MG/0.5ML ~~LOC~~ SOAJ: 2.5000 mg | SUBCUTANEOUS | 0 refills | Status: AC

## 2024-09-25 NOTE — Patient Instructions (Signed)
 Medication Instructions:  No changes *If you need a refill on your cardiac medications before your next appointment, please call your pharmacy*  Lab Work: None ordered If you have labs (blood work) drawn today and your tests are completely normal, you will receive your results only by: MyChart Message (if you have MyChart) OR A paper copy in the mail If you have any lab test that is abnormal or we need to change your treatment, we will call you to review the results.  Testing/Procedures: Your physician has requested that you have an echocardiogram. Echocardiography is a painless test that uses sound waves to create images of your heart. It provides your doctor with information about the size and shape of your heart and how well your hearts chambers and valves are working.   You may receive an ultrasound enhancing agent through an IV if needed to better visualize your heart during the echo. This procedure takes approximately one hour.  There are no restrictions for this procedure.  This will take place at [address] [facility]) [address], [place] [address]  Please note: We ask at that you not bring children with you during ultrasound (echo/ vascular) testing. Due to room size and safety concerns, children are not allowed in the ultrasound rooms during exams. Our front office staff cannot provide observation of children in our lobby area while testing is being conducted. An adult accompanying a patient to their appointment will only be allowed in the ultrasound room at the discretion of the ultrasound technician under special circumstances. We apologize for any inconvenience.   Follow-Up: At [facility], you and your health needs are our priority.  As part of our continuing mission to provide you with exceptional heart care, our providers are all part of one team.  This team includes your primary Cardiologist (physician) and Advanced Practice Providers or APPs  (Physician Assistants and Nurse Practitioners) who all work together to provide you with the care you need, when you need it.  Your next appointment:   1 month(s)  Provider:   You may see Dr. Darron or one of the following Advanced Practice Providers on your designated Care Team:   Lonni Meager, NP Lesley Maffucci, PA-C Bernardino Bring, PA-C Cadence [place], PA-C Tylene Lunch, NP Barnie Hila, NP    We recommend signing up for the patient portal called MyChart.  Sign up information is provided on this After Visit Summary.  MyChart is used to connect with patients for Virtual Visits (Telemedicine).  Patients are able to view lab/test results, encounter notes, upcoming appointments, etc.  Non-urgent messages can be sent to your provider as well.   To learn more about what you can do with MyChart, go to [email or web address].   Other Instructions   Your cardiac CT will be scheduled at one of the below locations:   [facility] [address] [place], [place] [address] [phone] (Severe contrast allergies only)  OR   [facility] [address] [place], [place] [address] [phone]  OR   [facility] [address] [place], [place] [address] [phone]  OR   [facility] and Vascular Tower [address]  [place], [place] [address]  OR   [facility] [address] [place], [place] [phone]  If scheduled at [facility], please arrive at the [facility] and Children's Entrance (Entrance C2) of [facility] 30 minutes prior to test start time. You can  use the FREE valet parking offered at entrance C (encouraged to control the heart rate for the test)  Proceed to the [facility] Radiology Department (first floor) to check-in and test prep.  All radiology patients and guests should use entrance C2 at [facility], accessed from [facility], even though the  hospital's physical address listed is [address].  If scheduled at the Heart and Vascular Tower at [address], please enter the parking lot using the [address] entrance and use the FREE valet service at the patient drop-off area. Enter the building and check-in with registration on the main floor.  If scheduled at [facility], please arrive to the Heart and Vascular Center 15 mins early for check-in and test prep.  There is spacious parking and easy access to the radiology department from the [facility] and Vascular entrance. Please enter here and check-in with the desk attendant.   If scheduled at [facility], please arrive 30 minutes early for check-in and test prep.  Please follow these instructions carefully (unless otherwise directed):  An IV will be required for this test and Nitroglycerin will be given.  Hold all erectile dysfunction medications at least 3 days (72 hrs) prior to test. (Ie viagra, cialis, sildenafil, tadalafil, etc)   On the Night Before the Test: Be sure to Drink plenty of water. Do not consume any caffeinated/decaffeinated beverages or chocolate 12 hours prior to your test. Do not take any antihistamines 12 hours prior to your test.  On the Day of the Test: Drink plenty of water until 1 hour prior to the test. Do not eat any food 1 hour prior to test. You may take your regular medications prior to the test.  Take metoprolol  (Lopressor ) two hours prior to test. If you take Furosemide/Hydrochlorothiazide/Spironolactone/Chlorthalidone, please HOLD on the morning of the test. Patients who wear a continuous glucose monitor MUST remove the device prior to scanning.   After the Test: Drink plenty of water. After receiving IV contrast, you may experience a mild flushed feeling. This is normal. On occasion, you may experience a mild rash up to 24 hours after the test. This is not dangerous. If this occurs, you can  take Benadryl 25 mg, Zyrtec, Claritin, or Allegra and increase your fluid intake. (Patients taking Tikosyn should avoid Benadryl, and may take Zyrtec, Claritin, or Allegra) If you experience trouble breathing, this can be serious. If it is severe call 911 IMMEDIATELY. If it is mild, please call our office.  We will call to schedule your test 2-4 weeks out understanding that some insurance companies will need an authorization prior to the service being performed.   For more information and frequently asked questions, please visit our website : [email or web address]  For non-scheduling related questions, please contact the cardiac imaging nurse navigator should you have any questions/concerns: Cardiac Imaging Nurse Navigators Direct Office Dial: [phone]   For scheduling needs, including cancellations and rescheduling, please call Brittany, [phone].

## 2024-09-25 NOTE — Progress Notes (Signed)
 "    Cardiology Office Note   Date:  09/25/2024   ID:  Mark Arroyo, DOB Jan 09, 1971, MRN [id number]  PCP:  Arroyo Mark GORMAN, PA-C  Cardiologist:   [name], MD   Chief Complaint  Patient presents with   Follow-up    Heart disease screening no complaints today. Meds reviewed verbally with pt.      History of Present Illness: Mark Arroyo is a 54 y.o. male who was referred by Mark Arroyo for evaluation of coronary artery disease.  He has known history of type 2 diabetes, mixed hyperlipidemia, obesity and family history of premature coronary artery disease.  His father had myocardial infarction at the age of 16.  The patient is not a smoker and drinks occasional alcohol.  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  No previous cardiac history. He used to play basketball when he was younger.  He recently tried to start an exercise program what was limited by severe exertional dyspnea.  No chest pain, palpitations or lower extremity edema.  No previous cardiac workup.    Past Medical History:  Diagnosis Date   Asthma    Diabetes mellitus type 2 with complications (HCC)    GERD (gastroesophageal reflux disease)    intermittent   Migraines    as adult, age 2, usually stress related, no frequent headaches since 04/2014   Mixed dyslipidemia    Seasonal allergic rhinitis    Vitamin D  deficiency    Wears glasses     Past Surgical History:  Procedure Laterality Date   ANTERIOR CERVICAL DECOMP/DISCECTOMY FUSION  09/07/2011   Dr. Beuford   COLONOSCOPY  43   due to stomach issues, normal, [place]   COLONOSCOPY  11/2021   Dr. Rollin, repeat 7 years   INGUINAL HERNIA REPAIR  09/06/2001   right   MEDIAL COLLATERAL LIGAMENT REPAIR, KNEE  09/06/2009   left   NASAL SINUS SURGERY  06/25/2015   x2,    SHOULDER SURGERY Left 08/06/2013   left, labrum repair, RTC repair, spur removal; [place]   VASECTOMY  09/07/2011     Current Outpatient Medications   Medication Sig Dispense Refill   albuterol  (VENTOLIN  HFA) 108 (90 Base) MCG/ACT inhaler INHALE 2 PUFFS BY MOUTH EVERY 6 HOURS AS NEEDED FOR WHEEZING OR SHORTNESS OF BREATH 8 each 1   Azelastine HCl 137 MCG/SPRAY SOLN Place 2 sprays into both nostrils 2 (two) times daily.     Cholecalciferol (VITAMIN D ) 50 MCG (2000 UT) CAPS Take 1 capsule (2,000 Units total) by mouth daily. 90 capsule 1   ezetimibe  (ZETIA ) 10 MG tablet Take 1 tablet (10 mg total) by mouth daily. 90 tablet 3   metFORMIN  (GLUCOPHAGE -XR) 500 MG 24 hr tablet Take 1 tablet (500 mg total) by mouth daily with breakfast. 90 tablet 3   rosuvastatin  (CRESTOR ) 20 MG tablet Take 1 tablet (20 mg total) by mouth daily. 90 tablet 3   tirzepatide  (MOUNJARO ) 2.5 MG/0.5ML Pen Inject 2.5 mg into the skin once a week. (Patient not taking: Reported on 09/25/2024) 2 mL 0   No current facility-administered medications for this visit.   Facility-Administered Medications Ordered in Other Visits  Medication Dose Route Frequency Provider Last Rate Last Admin   gadopentetate dimeglumine  (MAGNEVIST ) injection 20 mL  20 mL Intravenous Once PRN Penumalli, Mark R, MD        Allergies:   Patient has no known allergies.    Social History:  The patient  reports  that he has never smoked. He has never used smokeless tobacco. He reports that he does not drink alcohol and does not use drugs.   Family History:  The patient's family history includes Cancer in his sister; Diabetes in his brother, mother, and sister; Heart attack in his father; Heart disease (age of onset: 8) in his father; Kidney disease in his sister; Prostatitis in his father.    ROS:  Please see the history of present illness.   Otherwise, review of systems are positive for none.   All other systems are reviewed and negative.    PHYSICAL EXAM: VS:  BP 110/72 (BP Location: Right Arm, Cuff Size: Large)   Pulse 99   Ht 5' 11 (1.803 m)   Wt 235 lb 6 oz (106.8 kg)   SpO2 99%   BMI 32.83  kg/m  , BMI Body mass index is 32.83 kg/m. GEN: Well nourished, well developed, in no acute distress  HEENT: normal  Neck: no JVD, carotid bruits, or masses Cardiac: RRR; no murmurs, rubs, or gallops,no edema  Respiratory:  clear to auscultation bilaterally, normal work of breathing GI: soft, nontender, nondistended, + BS MS: no deformity or atrophy  Skin: warm and dry, no rash Neuro:  Strength and sensation are intact Psych: euthymic mood, full affect   EKG:  EKG is ordered today. The ekg ordered today demonstrates : Normal sinus rhythm Normal ECG       Recent Labs: 03/13/2024: ALT 15; BUN 8; Creatinine, Ser 0.89; Potassium 4.3; Sodium 141    Lipid Panel    Component Value Date/Time   CHOL 202 (H) 03/13/2024 1358   TRIG 106 03/13/2024 1358   HDL 44 03/13/2024 1358   CHOLHDL 4.6 03/13/2024 1358   CHOLHDL 3.9 02/23/2017 0815   VLDL 23 02/23/2017 0815   LDLCALC 139 (H) 03/13/2024 1358      Wt Readings from Last 3 Encounters:  09/25/24 235 lb 6 oz (106.8 kg)  09/25/24 237 lb 6.4 oz (107.7 kg)  04/27/24 225 lb 9.6 oz (102.3 kg)          09/25/2024    3:47 PM  PAD Screen  Previous PAD dx? No  Previous surgical procedure? No  Pain with walking? No  Feet/toe relief with dangling? No  Painful, non-healing ulcers? No  Extremities discolored? No      ASSESSMENT AND PLAN:  1.  Severe exertional dyspnea in a patient with multiple risk factors for coronary artery disease and known history of type 2 diabetes.  This is likely anginal equivalent.  Recommend evaluation with cardiac CTA.  Will also obtain an echocardiogram to evaluate ejection fraction and diastolic function.  2.  Hyperlipidemia: Most recent LDL was 139.  He is on rosuvastatin  and ezetimibe .  I suspect that he will require more aggressive control of his lipids and might require a PCSK9 inhibitor.  3.  Type 2 diabetes: Most recent hemoglobin A1c was 6.5.    Disposition:   FU after cardiac  testing.  Signed,  [name], MD  09/25/2024 3:56 PM    [facility] "

## 2024-09-25 NOTE — Progress Notes (Signed)
 "  Name: Mark Arroyo   Date of Visit: 09/25/24   Date of last visit with me: 09/02/2024   CHIEF COMPLAINT:  Chief Complaint  Patient presents with   Annual Exam    Fasting cpe, needs colonoscopy, has pain at neck when trying to do push up. Changed insurances so would like to try ozempic  again. Declines shots today. Eye exam done at [facility] (requested records)       HPI:  Discussed the use of AI scribe software for clinical note transcription with the patient, who gave verbal consent to proceed.  History of Present Illness   Mark Arroyo is a 54 year old male who presents for an annual physical exam.  He is currently taking rosuvastatin  20 mg daily and Zetia  10 mg daily for high cholesterol, vitamin D  2000 units daily for vitamin D  deficiency, and metformin  XR 500 mg once daily. He uses Astelin nasal spray and an albuterol  inhaler as needed. He was previously on Mounjaro  but had insurance coverage issues.  He has a history of asthma and last underwent spirometry four years ago, which showed mild airway obstruction. No recent breathing problems.  He has a history of shoulder surgery and reports current left shoulder pain, particularly with certain movements such as push-ups. The pain is described as a pulling sensation in the shoulder and collarbone area, which started after attempting push-ups. An x-ray at an orthopedic urgent care showed no fractures, but he has not had an MRI. The pain has persisted for a couple of months, with some improvement but still present intermittently.  He gets up once at night to urinate. He has a history of a vasectomy, shoulder surgery, sinus surgery, hernia repair, and neck surgery.  He has not been exercising regularly due to cold weather but was previously doing push-ups. He experiences pain in the shoulder when attempting push-ups, which he attributes to a possible strain or injury during exercise.  Allergies[1]  Past Medical History:   Diagnosis Date   Asthma    Diabetes mellitus type 2 with complications (HCC)    GERD (gastroesophageal reflux disease)    intermittent   Migraines    as adult, age 45, usually stress related, no frequent headaches since 04/2014   Mixed dyslipidemia    Seasonal allergic rhinitis    Vitamin D  deficiency    Wears glasses     Medications Ordered Prior to Encounter[2]   Current Medications[3]  Family History  Problem Relation Age of Onset   Diabetes Mother    Heart disease Father 53       MI   Prostatitis Father    Cancer Sister    Kidney disease Sister    Diabetes Sister    Diabetes Brother     Past Surgical History:  Procedure Laterality Date   ANTERIOR CERVICAL DECOMP/DISCECTOMY FUSION  09/07/2011   Dr. Beuford   COLONOSCOPY  43   due to stomach issues, normal, [place]   COLONOSCOPY  11/2021   Dr. Rollin, repeat 7 years   INGUINAL HERNIA REPAIR  09/06/2001   right   MEDIAL COLLATERAL LIGAMENT REPAIR, KNEE  09/06/2009   left   NASAL SINUS SURGERY  06/25/2015   x2,    SHOULDER SURGERY Left 08/06/2013   left, labrum repair, RTC repair, spur removal; [place]   VASECTOMY  09/07/2011     ROS as in subjective    Objective:  BP 112/70   Pulse 81   Ht 5' 11 (1.803  m)   Wt 237 lb 6.4 oz (107.7 kg)   SpO2 96%   BMI 33.11 kg/m   Wt Readings from Last 3 Encounters:  09/25/24 237 lb 6.4 oz (107.7 kg)  04/27/24 225 lb 9.6 oz (102.3 kg)  03/13/24 234 lb (106.1 kg)   BP Readings from Last 3 Encounters:  09/25/24 112/70  04/27/24 120/78  03/13/24 108/86    General appearence: alert, no distress, WD/WN, African American male HEENT: normocephalic, sclerae anicteric, PERRLA, EOMi, nares patent, no discharge or erythema, pharynx normal Oral cavity: MMM, no lesions Neck: supple, no lymphadenopathy, no thyromegaly, no masses, no bruits Heart: RRR, normal S1, S2, no murmurs Lungs: CTA bilaterally, no wheezes, rhonchi, or rales Abdomen: +bs, soft, non  tender, non distended, no masses, no hepatomegaly, no splenomegaly Back: non tender Musculoskeletal: Mild tenderness anterior deltoid, otherwise nontender to palpation left shoulder, range of motion normal mild pain with crossover test and flexion of the shoulder, otherwise shoulder exam unremarkable, otherwise UE and LE nontender, no swelling, no obvious deformity Extremities: no edema, no cyanosis, no clubbing Pulses: 2+ symmetric, upper and lower extremities, normal cap refill Neurological: alert, oriented x 3, CN2-12 intact, strength normal upper extremities and lower extremities, sensation normal throughout, DTRs 2+ throughout, no cerebellar signs, gait normal Psychiatric: normal affect, behavior normal, pleasant   GU: Normal male, circumcised, no mass, no hernia no lymphadenopathy Rectal: Anus normal tone, prostate mild to moderate lodgment, no nodule  Diabetic Foot Exam - Simple   Simple Foot Form Visual Inspection No deformities, no ulcerations, no other skin breakdown bilaterally: Yes Sensation Testing Intact to touch and monofilament testing bilaterally: Yes Pulse Check Posterior Tibialis and Dorsalis pulse intact bilaterally: Yes Comments       Assessment: Encounter Diagnoses  Name Primary?   Encounter for health maintenance examination in adult Yes   Mild intermittent asthma without complication    Mixed dyslipidemia    Screening for prostate cancer    Type II diabetes mellitus with complication (HCC)    Vaccine refused by patient    Vitamin D  deficiency    Family history of premature CAD    Screening for heart disease    Left shoulder pain, unspecified chronicity      Plan:  General Health Maintenance Routine health maintenance discussed.  -Colonoscopy in March 2023, repeat in seven years.  - Recommended annual dental and eye exams. - Provided stool blood test kit for FIT test  Type 2 diabetes mellitus with unspecified complications Type 2 diabetes  managed with metformin . Mounjaro  to be resumed due to improved insurance coverage. Discussed benefits of Mounjaro  over Ozempic . - Continue metformin  XR 500 mg daily. - Prescribed Mounjaro  as insurance coverage allows.  Mixed dyslipidemia Managed with rosuvastatin  and Zetia . - Continue rosuvastatin  20 mg daily. - Continue Zetia  10 mg daily.  Mild intermittent asthma Managed with albuterol  inhaler. Spirometry needed to update baseline. - Performed updated spirometry   Vitamin D  deficiency Managed with vitamin D  supplementation. - Continue vitamin D  2000 units daily.  Left shoulder pain/neck pain Intermittent pain with tenderness. Differential includes neck muscle strain or accessory muscle involvement. -likely strain/sprain injury of left shoulder -use relative rest, stretching, and consider consult with sports medicine or PT referral  Benign prostatic hyperplasia Mild to moderate prostate enlargement with nocturia. - Performed digital rectal exam. - Ordered PSA blood test.  Prostate cancer screening Annual PSA and digital rectal exam performed. - Ordered PSA blood test.  Cardiovascular disease screening Discussed screening options. Referral  to cardiology for further evaluation. - Referred to cardiology for further evaluation.  Immunization counseling and refusal Discussed shingles and flu vaccinations. - Encouraged shingles and flu vaccinations. -declines vaccines   Mark Arroyo was seen today for annual exam.  Diagnoses and all orders for this visit:  Encounter for health maintenance examination in adult -     Ambulatory referral to Cardiology -     CBC -     Comprehensive metabolic panel with GFR -     Lipid panel -     PSA -     TSH -     Hemoglobin A1c -     Microalbumin/Creatinine Ratio, Urine -     Urinalysis, Routine w reflex microscopic -     VITAMIN D  25 Hydroxy (Vit-D Deficiency, Fractures)  Mild intermittent asthma without complication  Mixed  dyslipidemia -     Lipid panel  Screening for prostate cancer -     PSA  Type II diabetes mellitus with complication (HCC) -     Ambulatory referral to Cardiology -     Hemoglobin A1c -     Microalbumin/Creatinine Ratio, Urine  Vaccine refused by patient  Vitamin D  deficiency -     VITAMIN D  25 Hydroxy (Vit-D Deficiency, Fractures)  Family history of premature CAD  Screening for heart disease -     Ambulatory referral to Cardiology  Left shoulder pain, unspecified chronicity  Other orders -     rosuvastatin  (CRESTOR ) 20 MG tablet; Take 1 tablet (20 mg total) by mouth daily. -     metFORMIN  (GLUCOPHAGE -XR) 500 MG 24 hr tablet; Take 1 tablet (500 mg total) by mouth daily with breakfast. -     ezetimibe  (ZETIA ) 10 MG tablet; Take 1 tablet (10 mg total) by mouth daily. -     albuterol  (VENTOLIN  HFA) 108 (90 Base) MCG/ACT inhaler; INHALE 2 PUFFS BY MOUTH EVERY 6 HOURS AS NEEDED FOR WHEEZING OR SHORTNESS OF BREATH -     tirzepatide  (MOUNJARO ) 2.5 MG/0.5ML Pen; Inject 2.5 mg into the skin once a week.    F/u pending labs, referral        [1] No Known Allergies [2]  Current Outpatient Medications on File Prior to Visit  Medication Sig Dispense Refill   Azelastine HCl 137 MCG/SPRAY SOLN Place 2 sprays into both nostrils 2 (two) times daily.     Cholecalciferol (VITAMIN D ) 50 MCG (2000 UT) CAPS Take 1 capsule (2,000 Units total) by mouth daily. 90 capsule 1   Current Facility-Administered Medications on File Prior to Visit  Medication Dose Route Frequency Provider Last Rate Last Admin   gadopentetate dimeglumine  (MAGNEVIST ) injection 20 mL  20 mL Intravenous Once PRN Penumalli, Vikram R, MD      [3]  Current Outpatient Medications:    Azelastine HCl 137 MCG/SPRAY SOLN, Place 2 sprays into both nostrils 2 (two) times daily., Disp: , Rfl:    Cholecalciferol (VITAMIN D ) 50 MCG (2000 UT) CAPS, Take 1 capsule (2,000 Units total) by mouth daily., Disp: 90 capsule, Rfl: 1    tirzepatide  (MOUNJARO ) 2.5 MG/0.5ML Pen, Inject 2.5 mg into the skin once a week., Disp: 2 mL, Rfl: 0   albuterol  (VENTOLIN  HFA) 108 (90 Base) MCG/ACT inhaler, INHALE 2 PUFFS BY MOUTH EVERY 6 HOURS AS NEEDED FOR WHEEZING OR SHORTNESS OF BREATH, Disp: 8 each, Rfl: 1   ezetimibe  (ZETIA ) 10 MG tablet, Take 1 tablet (10 mg total) by mouth daily., Disp: 90 tablet, Rfl: 3   metFORMIN  (  GLUCOPHAGE -XR) 500 MG 24 hr tablet, Take 1 tablet (500 mg total) by mouth daily with breakfast., Disp: 90 tablet, Rfl: 3   rosuvastatin  (CRESTOR ) 20 MG tablet, Take 1 tablet (20 mg total) by mouth daily., Disp: 90 tablet, Rfl: 3 No current facility-administered medications for this visit.  Facility-Administered Medications Ordered in Other Visits:    gadopentetate dimeglumine  (MAGNEVIST ) injection 20 mL, 20 mL, Intravenous, Once PRN, Penumalli, Vikram R, MD  "

## 2024-09-26 ENCOUNTER — Ambulatory Visit: Payer: Self-pay | Admitting: Medical

## 2024-09-26 ENCOUNTER — Other Ambulatory Visit: Payer: Self-pay | Admitting: Medical

## 2024-09-26 LAB — COMPREHENSIVE METABOLIC PANEL WITH GFR
ALT: 15 IU/L (ref 0–44)
AST: 15 IU/L (ref 0–40)
Albumin: 4.3 g/dL (ref 3.8–4.9)
Alkaline Phosphatase: 71 IU/L (ref 47–123)
BUN/Creatinine Ratio: 9 (ref 9–20)
BUN: 9 mg/dL (ref 6–24)
Bilirubin Total: 1.1 mg/dL (ref 0.0–1.2)
CO2: 22 mmol/L (ref 20–29)
Calcium: 9.3 mg/dL (ref 8.7–10.2)
Chloride: 100 mmol/L (ref 96–106)
Creatinine, Ser: 0.96 mg/dL (ref 0.76–1.27)
Globulin, Total: 3.1 g/dL (ref 1.5–4.5)
Glucose: 119 mg/dL — ABNORMAL HIGH (ref 70–99)
Potassium: 4.2 mmol/L (ref 3.5–5.2)
Sodium: 138 mmol/L (ref 134–144)
Total Protein: 7.4 g/dL (ref 6.0–8.5)
eGFR: 95 mL/min/1.73

## 2024-09-26 LAB — HEMOGLOBIN A1C
Est. average glucose Bld gHb Est-mCnc: 140 mg/dL
Hgb A1c MFr Bld: 6.5 % — ABNORMAL HIGH (ref 4.8–5.6)

## 2024-09-26 LAB — MICROALBUMIN / CREATININE URINE RATIO
Creatinine, Urine: 89.5 mg/dL
Microalb/Creat Ratio: <3 mg/g{creat} (ref 0–29)
Microalbumin, Urine: <3 ug/mL

## 2024-09-26 LAB — CBC
Hematocrit: 45.6 % (ref 37.5–51.0)
Hemoglobin: 14.2 g/dL (ref 13.0–17.7)
MCH: 26.2 pg — ABNORMAL LOW (ref 26.6–33.0)
MCHC: 31.1 g/dL — ABNORMAL LOW (ref 31.5–35.7)
MCV: 84 fL (ref 79–97)
Platelets: 250 x10E3/uL (ref 150–450)
RBC: 5.42 x10E6/uL (ref 4.14–5.80)
RDW: 13.5 % (ref 11.6–15.4)
WBC: 4.5 x10E3/uL (ref 3.4–10.8)

## 2024-09-26 LAB — LIPID PANEL
Chol/HDL Ratio: 4.2 ratio (ref 0.0–5.0)
Cholesterol, Total: 222 mg/dL — ABNORMAL HIGH (ref 100–199)
HDL: 53 mg/dL
LDL Chol Calc (NIH): 147 mg/dL — ABNORMAL HIGH (ref 0–99)
Triglycerides: 121 mg/dL (ref 0–149)
VLDL Cholesterol Cal: 22 mg/dL (ref 5–40)

## 2024-09-26 LAB — URINALYSIS, ROUTINE W REFLEX MICROSCOPIC
Specific Gravity, UA: 1.012 (ref 1.005–1.030)
Urobilinogen, Ur: 0.2 mg/dL (ref 0.2–1.0)
pH, UA: 6.5 (ref 5.0–7.5)

## 2024-09-26 LAB — TSH: TSH: 2.43 u[IU]/mL (ref 0.450–4.500)

## 2024-09-26 LAB — VITAMIN D 25 HYDROXY (VIT D DEFICIENCY, FRACTURES): Vit D, 25-Hydroxy: 12.3 ng/mL — AB (ref 30.0–100.0)

## 2024-09-26 LAB — PSA: Prostate Specific Ag, Serum: 1.3 ng/mL (ref 0.0–4.0)

## 2024-09-26 MED ORDER — VITAMIN D (ERGOCALCIFEROL) 1.25 MG (50000 UNIT) PO CAPS: 50000.0000 [IU] | ORAL_CAPSULE | ORAL | 1 refills | Status: AC

## 2024-09-26 NOTE — Progress Notes (Signed)
 Results to MyChart

## 2024-09-26 NOTE — Progress Notes (Signed)
 Results through MyChart

## 2024-10-10 ENCOUNTER — Ambulatory Visit

## 2024-10-10 ENCOUNTER — Encounter (HOSPITAL_COMMUNITY): Payer: Self-pay

## 2024-10-10 ENCOUNTER — Telehealth (HOSPITAL_COMMUNITY): Payer: Self-pay | Admitting: Emergency Medicine

## 2024-10-10 NOTE — Telephone Encounter (Signed)
 Reaching out to patient to offer assistance regarding upcoming cardiac imaging study; pt verbalizes understanding of appt date/time, parking situation and where to check in, pre-test NPO status and medications ordered, and verified current allergies; name and call back number provided for further questions should they arise Rockwell Alexandria RN Navigator Cardiac Imaging [facility] [phone] office [phone] cell

## 2024-10-11 ENCOUNTER — Ambulatory Visit: Payer: Self-pay | Admitting: Cardiovascular Disease

## 2024-10-11 ENCOUNTER — Ambulatory Visit
Admission: RE | Admit: 2024-10-11 | Discharge: 2024-10-11 | Disposition: A | Source: Ambulatory Visit | Attending: Cardiovascular Disease | Admitting: Cardiovascular Disease

## 2024-10-11 DIAGNOSIS — I2089 Other forms of angina pectoris: Secondary | ICD-10-CM

## 2024-10-11 MED ORDER — NITROGLYCERIN 0.4 MG SL SUBL
0.8000 mg | SUBLINGUAL_TABLET | Freq: Once | SUBLINGUAL | Status: AC
  Administered 2024-10-11: 0.8 mg via SUBLINGUAL
  Filled 2024-10-11: qty 25

## 2024-10-11 MED ORDER — METOPROLOL TARTRATE 5 MG/5ML IV SOLN
INTRAVENOUS | Status: AC
  Filled 2024-10-11: qty 10

## 2024-10-11 MED ORDER — IOHEXOL 350 MG/ML SOLN
100.0000 mL | Freq: Once | INTRAVENOUS | Status: AC | PRN
  Administered 2024-10-11: 100 mL via INTRAVENOUS

## 2024-10-11 MED ORDER — DILTIAZEM HCL 25 MG/5ML IV SOLN
10.0000 mg | INTRAVENOUS | Status: DC | PRN
  Filled 2024-10-11: qty 5

## 2024-10-11 MED ORDER — METOPROLOL TARTRATE 5 MG/5ML IV SOLN
10.0000 mg | Freq: Once | INTRAVENOUS | Status: AC | PRN
  Administered 2024-10-11: 10 mg via INTRAVENOUS
  Filled 2024-10-11: qty 10

## 2024-10-22 ENCOUNTER — Ambulatory Visit

## 2024-10-26 ENCOUNTER — Ambulatory Visit: Admitting: Nurse Practitioner

## 2024-12-24 ENCOUNTER — Ambulatory Visit: Admitting: Medical

## 2025-09-30 ENCOUNTER — Encounter: Admitting: Medical
